# Patient Record
Sex: Female | Born: 1985 | Race: Black or African American | Hispanic: No | Marital: Single | Smoking: Never smoker
Health system: Southern US, Community
[De-identification: ages and names within clinical notes are randomized; demographics above are authoritative.]

## PROBLEM LIST (undated history)

## (undated) ENCOUNTER — Inpatient Hospital Stay: Admission: EM | Payer: Self-pay | Source: Home / Self Care

## (undated) DIAGNOSIS — Z8742 Personal history of other diseases of the female genital tract: Secondary | ICD-10-CM

## (undated) DIAGNOSIS — Z800 Family history of malignant neoplasm of digestive organs: Secondary | ICD-10-CM

## (undated) DIAGNOSIS — Z803 Family history of malignant neoplasm of breast: Secondary | ICD-10-CM

## (undated) DIAGNOSIS — G43909 Migraine, unspecified, not intractable, without status migrainosus: Secondary | ICD-10-CM

## (undated) DIAGNOSIS — Z8042 Family history of malignant neoplasm of prostate: Secondary | ICD-10-CM

## (undated) DIAGNOSIS — Z801 Family history of malignant neoplasm of trachea, bronchus and lung: Secondary | ICD-10-CM

## (undated) HISTORY — DX: Family history of malignant neoplasm of breast: Z80.3

## (undated) HISTORY — DX: Family history of malignant neoplasm of trachea, bronchus and lung: Z80.1

## (undated) HISTORY — DX: Migraine, unspecified, not intractable, without status migrainosus: G43.909

## (undated) HISTORY — PX: TUBAL LIGATION: SHX77

## (undated) HISTORY — DX: Family history of malignant neoplasm of prostate: Z80.42

## (undated) HISTORY — DX: Family history of malignant neoplasm of digestive organs: Z80.0

## (undated) HISTORY — PX: APPENDECTOMY: SHX54

---
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
## 2004-05-27 ENCOUNTER — Other Ambulatory Visit: Admission: RE | Admit: 2004-05-27 | Discharge: 2004-05-27 | Payer: Self-pay | Admitting: Gynecology

## 2004-08-11 ENCOUNTER — Inpatient Hospital Stay (HOSPITAL_COMMUNITY): Admission: AD | Admit: 2004-08-11 | Discharge: 2004-08-11 | Payer: Self-pay | Admitting: Gynecology

## 2004-09-10 ENCOUNTER — Inpatient Hospital Stay (HOSPITAL_COMMUNITY): Admission: AD | Admit: 2004-09-10 | Discharge: 2004-09-10 | Payer: Self-pay | Admitting: Gynecology

## 2005-01-17 ENCOUNTER — Inpatient Hospital Stay (HOSPITAL_COMMUNITY): Admission: AD | Admit: 2005-01-17 | Discharge: 2005-01-26 | Payer: Self-pay

## 2005-01-17 ENCOUNTER — Ambulatory Visit: Payer: Self-pay | Admitting: Internal Medicine

## 2005-01-19 ENCOUNTER — Encounter (INDEPENDENT_AMBULATORY_CARE_PROVIDER_SITE_OTHER): Payer: Self-pay | Admitting: Specialist

## 2005-03-02 ENCOUNTER — Other Ambulatory Visit: Admission: RE | Admit: 2005-03-02 | Discharge: 2005-03-02 | Payer: Self-pay | Admitting: Gynecology

## 2006-09-16 ENCOUNTER — Emergency Department (HOSPITAL_COMMUNITY): Admission: EM | Admit: 2006-09-16 | Discharge: 2006-09-17 | Payer: Self-pay | Admitting: Emergency Medicine

## 2007-01-01 ENCOUNTER — Emergency Department (HOSPITAL_COMMUNITY): Admission: EM | Admit: 2007-01-01 | Discharge: 2007-01-02 | Payer: Self-pay | Admitting: Emergency Medicine

## 2007-05-29 ENCOUNTER — Ambulatory Visit: Payer: Self-pay | Admitting: Family Medicine

## 2007-05-29 LAB — CONVERTED CEMR LAB
ALT: 8 units/L (ref 0–35)
AST: 12 units/L (ref 0–37)
Albumin: 4.4 g/dL (ref 3.5–5.2)
Alkaline Phosphatase: 58 units/L (ref 39–117)
BUN: 16 mg/dL (ref 6–23)
Basophils Absolute: 0 10*3/uL (ref 0.0–0.1)
Basophils Relative: 1 % (ref 0–1)
CO2: 25 meq/L (ref 19–32)
Calcium: 9.1 mg/dL (ref 8.4–10.5)
Chloride: 105 meq/L (ref 96–112)
Creatinine, Ser: 0.62 mg/dL (ref 0.40–1.20)
Eosinophils Absolute: 0.1 10*3/uL (ref 0.0–0.7)
Eosinophils Relative: 2 % (ref 0–5)
Glucose, Bld: 87 mg/dL (ref 70–99)
HCT: 37.6 % (ref 36.0–46.0)
Hemoglobin: 11.7 g/dL — ABNORMAL LOW (ref 12.0–15.0)
Lymphocytes Relative: 47 % — ABNORMAL HIGH (ref 12–46)
Lymphs Abs: 2 10*3/uL (ref 0.7–4.0)
MCHC: 31.1 g/dL (ref 30.0–36.0)
MCV: 84.1 fL (ref 78.0–100.0)
Monocytes Absolute: 0.3 10*3/uL (ref 0.1–1.0)
Monocytes Relative: 6 % (ref 3–12)
Neutro Abs: 1.9 10*3/uL (ref 1.7–7.7)
Neutrophils Relative %: 45 % (ref 43–77)
Platelets: 257 10*3/uL (ref 150–400)
Potassium: 4 meq/L (ref 3.5–5.3)
RBC: 4.47 M/uL (ref 3.87–5.11)
RDW: 14.9 % (ref 11.5–15.5)
Sodium: 141 meq/L (ref 135–145)
TSH: 1.449 microintl units/mL (ref 0.350–5.50)
Total Bilirubin: 0.4 mg/dL (ref 0.3–1.2)
Total Protein: 7.5 g/dL (ref 6.0–8.3)
WBC: 4.2 10*3/uL (ref 4.0–10.5)

## 2007-07-27 ENCOUNTER — Ambulatory Visit: Payer: Self-pay | Admitting: Family Medicine

## 2007-07-27 ENCOUNTER — Ambulatory Visit: Payer: Self-pay | Admitting: *Deleted

## 2007-07-27 ENCOUNTER — Encounter (INDEPENDENT_AMBULATORY_CARE_PROVIDER_SITE_OTHER): Payer: Self-pay | Admitting: Family Medicine

## 2007-07-27 LAB — CONVERTED CEMR LAB
Chlamydia, DNA Probe: POSITIVE — AB
GC Probe Amp, Genital: POSITIVE — AB

## 2007-10-04 ENCOUNTER — Ambulatory Visit: Payer: Self-pay | Admitting: Internal Medicine

## 2007-11-01 ENCOUNTER — Ambulatory Visit: Payer: Self-pay | Admitting: Internal Medicine

## 2007-11-01 ENCOUNTER — Encounter (INDEPENDENT_AMBULATORY_CARE_PROVIDER_SITE_OTHER): Payer: Self-pay | Admitting: Family Medicine

## 2007-11-01 LAB — CONVERTED CEMR LAB
Chlamydia, DNA Probe: NEGATIVE
GC Probe Amp, Genital: NEGATIVE

## 2007-12-06 ENCOUNTER — Ambulatory Visit: Payer: Self-pay | Admitting: Family Medicine

## 2008-03-06 ENCOUNTER — Ambulatory Visit: Payer: Self-pay | Admitting: Family Medicine

## 2008-03-12 ENCOUNTER — Ambulatory Visit (HOSPITAL_COMMUNITY): Admission: RE | Admit: 2008-03-12 | Discharge: 2008-03-12 | Payer: Self-pay | Admitting: Family Medicine

## 2008-03-27 ENCOUNTER — Ambulatory Visit: Payer: Self-pay | Admitting: Family Medicine

## 2008-04-10 ENCOUNTER — Ambulatory Visit: Payer: Self-pay | Admitting: Family Medicine

## 2008-09-13 ENCOUNTER — Emergency Department (HOSPITAL_COMMUNITY): Admission: EM | Admit: 2008-09-13 | Discharge: 2008-09-13 | Payer: Self-pay | Admitting: Emergency Medicine

## 2008-09-14 ENCOUNTER — Emergency Department (HOSPITAL_COMMUNITY): Admission: EM | Admit: 2008-09-14 | Discharge: 2008-09-14 | Payer: Self-pay | Admitting: Emergency Medicine

## 2008-09-23 ENCOUNTER — Inpatient Hospital Stay (HOSPITAL_COMMUNITY): Admission: AD | Admit: 2008-09-23 | Discharge: 2008-09-23 | Payer: Self-pay | Admitting: Obstetrics & Gynecology

## 2008-10-12 ENCOUNTER — Inpatient Hospital Stay (HOSPITAL_COMMUNITY): Admission: AD | Admit: 2008-10-12 | Discharge: 2008-10-12 | Payer: Self-pay | Admitting: Obstetrics and Gynecology

## 2008-11-13 ENCOUNTER — Encounter: Admission: RE | Admit: 2008-11-13 | Discharge: 2008-11-13 | Payer: Self-pay | Admitting: Obstetrics and Gynecology

## 2008-11-29 ENCOUNTER — Inpatient Hospital Stay (HOSPITAL_COMMUNITY): Admission: AD | Admit: 2008-11-29 | Discharge: 2008-11-29 | Payer: Self-pay | Admitting: Obstetrics and Gynecology

## 2009-02-23 ENCOUNTER — Inpatient Hospital Stay (HOSPITAL_COMMUNITY): Admission: AD | Admit: 2009-02-23 | Discharge: 2009-02-23 | Payer: Self-pay | Admitting: Obstetrics and Gynecology

## 2009-02-25 ENCOUNTER — Inpatient Hospital Stay (HOSPITAL_COMMUNITY): Admission: AD | Admit: 2009-02-25 | Discharge: 2009-02-25 | Payer: Self-pay | Admitting: Obstetrics and Gynecology

## 2009-02-27 ENCOUNTER — Inpatient Hospital Stay (HOSPITAL_COMMUNITY): Admission: AD | Admit: 2009-02-27 | Discharge: 2009-02-27 | Payer: Self-pay | Admitting: Obstetrics and Gynecology

## 2009-02-28 ENCOUNTER — Ambulatory Visit (HOSPITAL_COMMUNITY): Admission: AD | Admit: 2009-02-28 | Discharge: 2009-02-28 | Payer: Self-pay | Admitting: Obstetrics and Gynecology

## 2009-03-14 ENCOUNTER — Inpatient Hospital Stay (HOSPITAL_COMMUNITY): Admission: AD | Admit: 2009-03-14 | Discharge: 2009-03-15 | Payer: Self-pay | Admitting: Obstetrics and Gynecology

## 2009-03-25 ENCOUNTER — Inpatient Hospital Stay (HOSPITAL_COMMUNITY): Admission: AD | Admit: 2009-03-25 | Discharge: 2009-03-25 | Payer: Self-pay | Admitting: Obstetrics and Gynecology

## 2009-03-31 ENCOUNTER — Observation Stay (HOSPITAL_COMMUNITY): Admission: AD | Admit: 2009-03-31 | Discharge: 2009-04-01 | Payer: Self-pay | Admitting: Obstetrics and Gynecology

## 2009-04-05 ENCOUNTER — Inpatient Hospital Stay (HOSPITAL_COMMUNITY): Admission: AD | Admit: 2009-04-05 | Discharge: 2009-04-06 | Payer: Self-pay | Admitting: Obstetrics and Gynecology

## 2009-04-14 ENCOUNTER — Encounter (INDEPENDENT_AMBULATORY_CARE_PROVIDER_SITE_OTHER): Payer: Self-pay | Admitting: Obstetrics and Gynecology

## 2009-04-14 ENCOUNTER — Inpatient Hospital Stay (HOSPITAL_COMMUNITY): Admission: AD | Admit: 2009-04-14 | Discharge: 2009-04-17 | Payer: Self-pay | Admitting: Obstetrics and Gynecology

## 2010-06-15 ENCOUNTER — Emergency Department (HOSPITAL_COMMUNITY)
Admission: EM | Admit: 2010-06-15 | Discharge: 2010-06-15 | Payer: Self-pay | Source: Home / Self Care | Admitting: Family Medicine

## 2010-06-16 LAB — POCT URINALYSIS DIPSTICK
Bilirubin Urine: NEGATIVE
Hgb urine dipstick: NEGATIVE
Ketones, ur: NEGATIVE mg/dL
Nitrite: NEGATIVE
Protein, ur: NEGATIVE mg/dL
Specific Gravity, Urine: 1.02 (ref 1.005–1.030)
Urine Glucose, Fasting: NEGATIVE mg/dL
Urobilinogen, UA: 0.2 mg/dL (ref 0.0–1.0)
pH: 7 (ref 5.0–8.0)

## 2010-06-16 LAB — POCT PREGNANCY, URINE: Preg Test, Ur: NEGATIVE

## 2010-06-20 ENCOUNTER — Encounter: Payer: Self-pay | Admitting: Family Medicine

## 2010-06-30 IMAGING — US US OB COMP LESS 14 WK
1 series · 14 of 25 positions shown · non-contrast
Comparison: None

CLINICAL DATA: *Abdominal pain; ;

OBSTETRIC <14 WK US AND TRANSVAGINAL OB US
TECHNIQUE: Both transabdominal and transvaginal ultrasound
examinations were performed for complete evaluation of the
gestation as well as the maternal uterus, adnexal regions, and
pelvic cul-de-sac.

[Series 1: us ob comp less 14 wks · 14 of 25 slices shown]
[im 1/25]
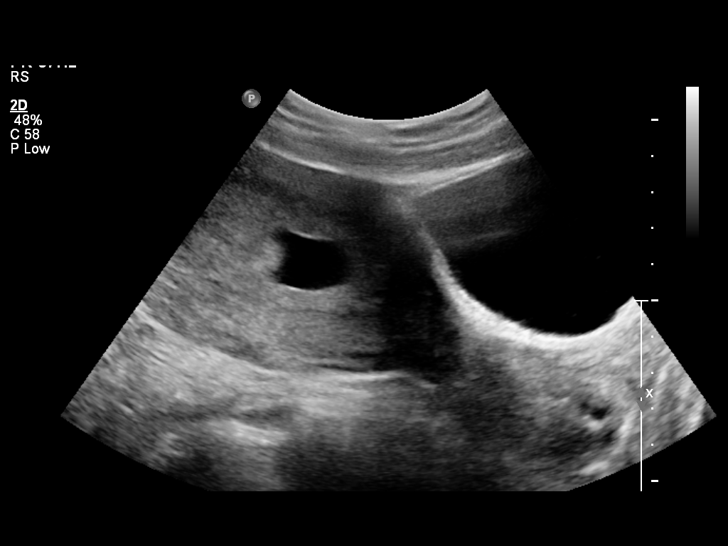
[im 3/25]
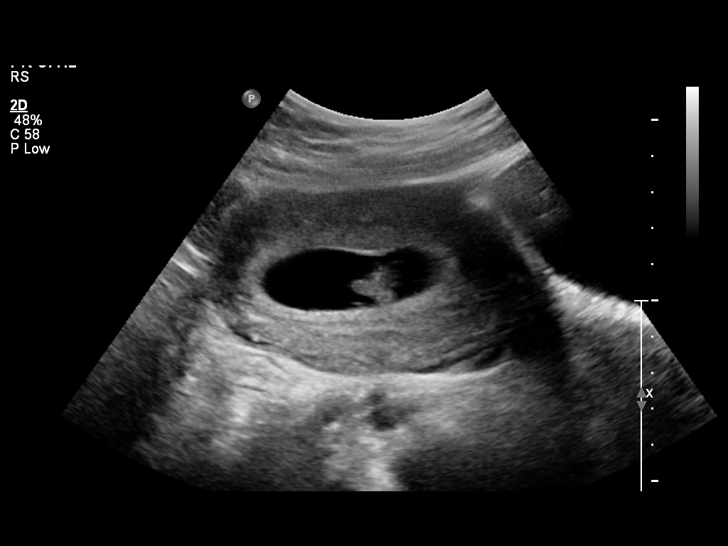
[im 5/25]
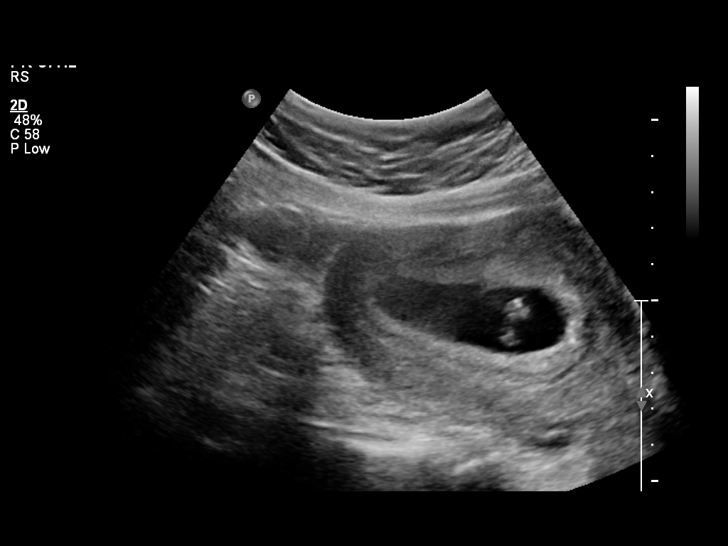
[im 7/25]
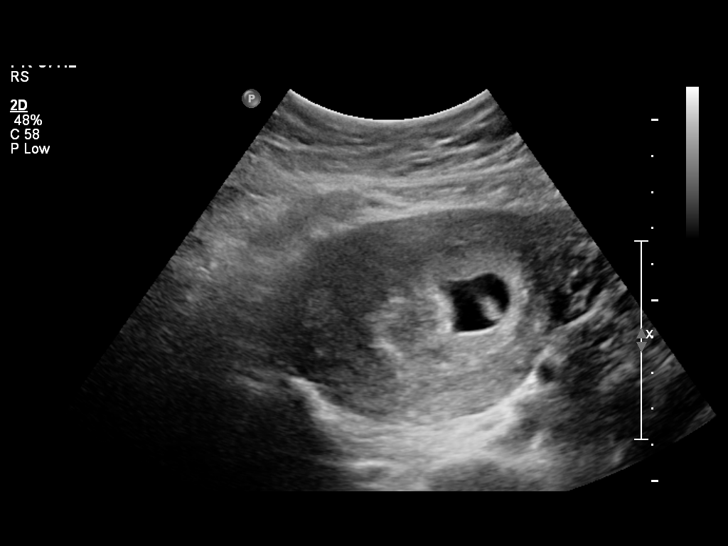
[im 9/25]
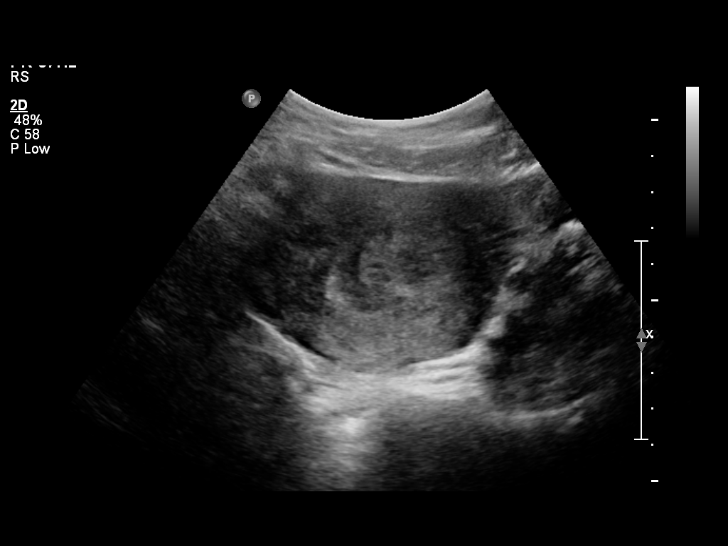
[im 10/25]
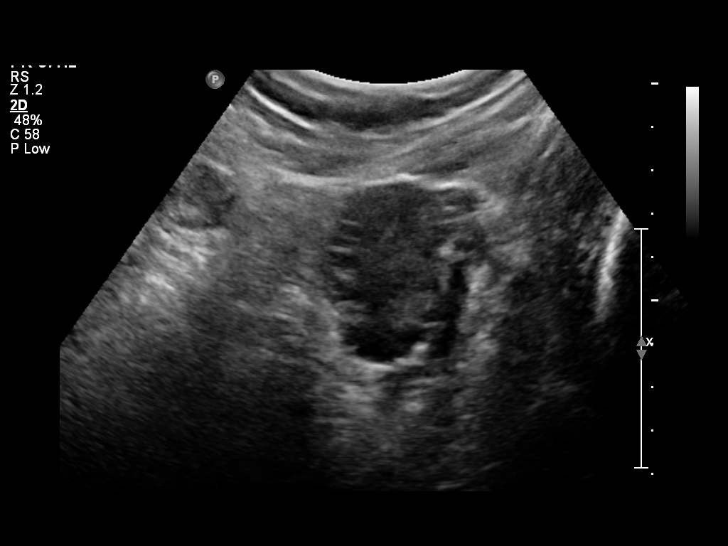
[im 12/25]
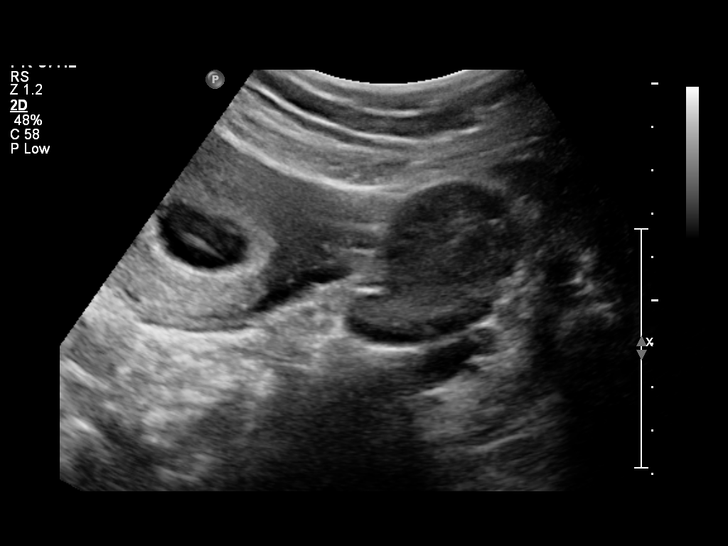
[im 14/25]
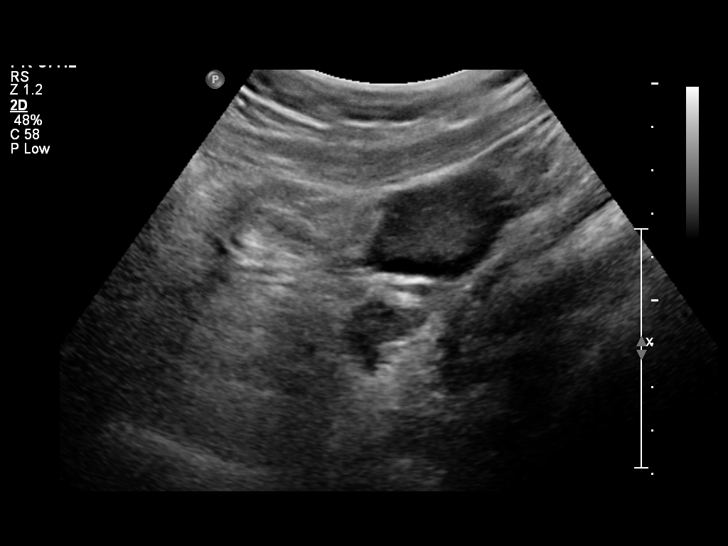
[im 16/25]
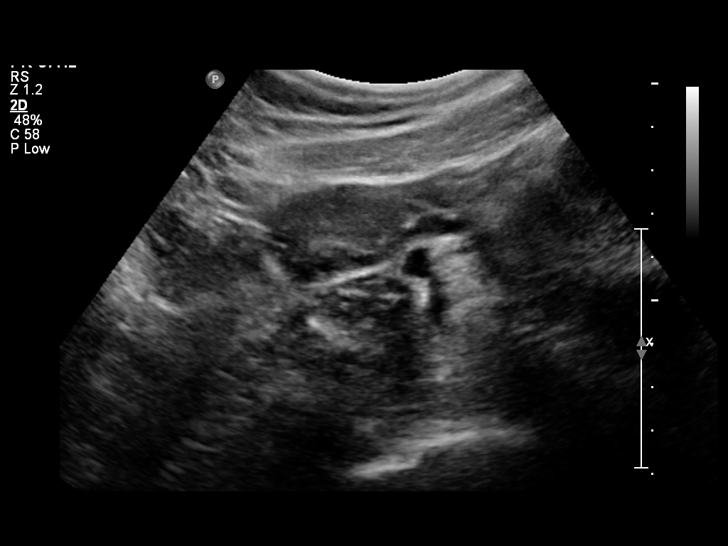
[im 17/25]
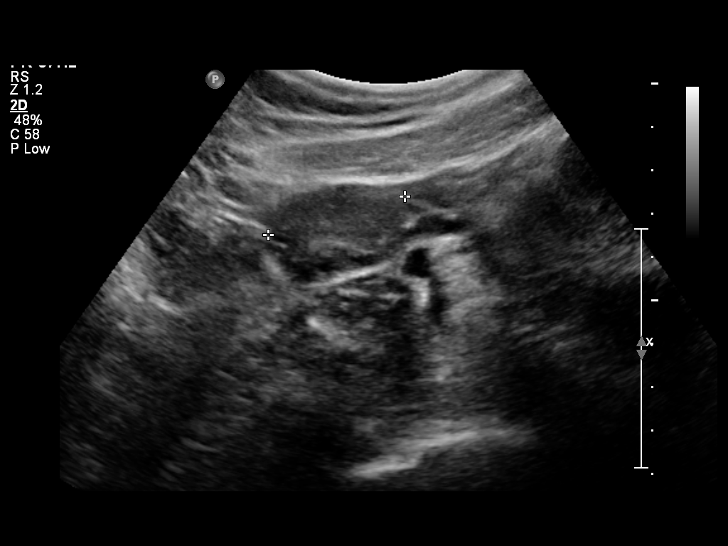
[im 19/25]
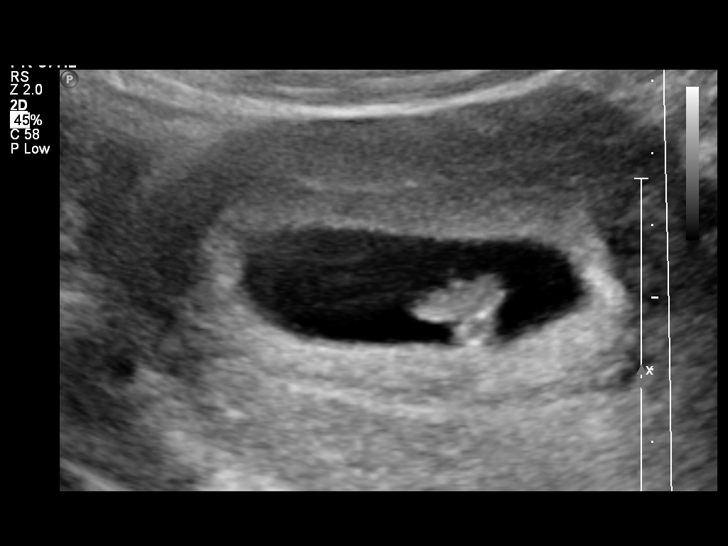
[im 21/25]
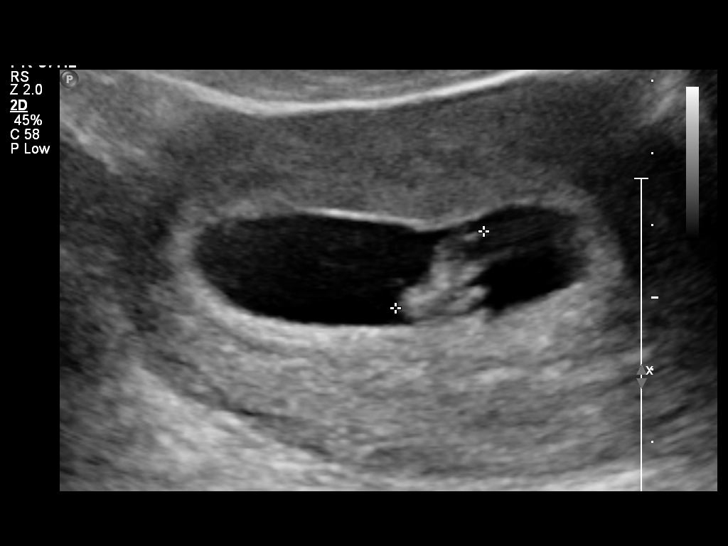
[im 23/25]
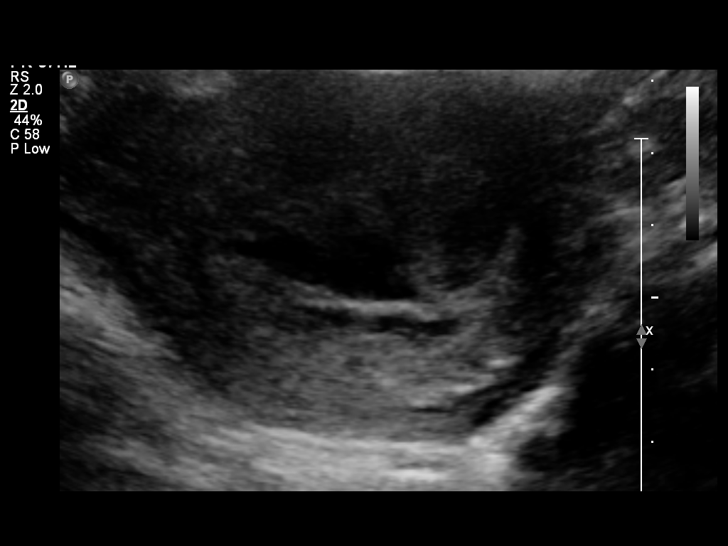
[im 25/25]
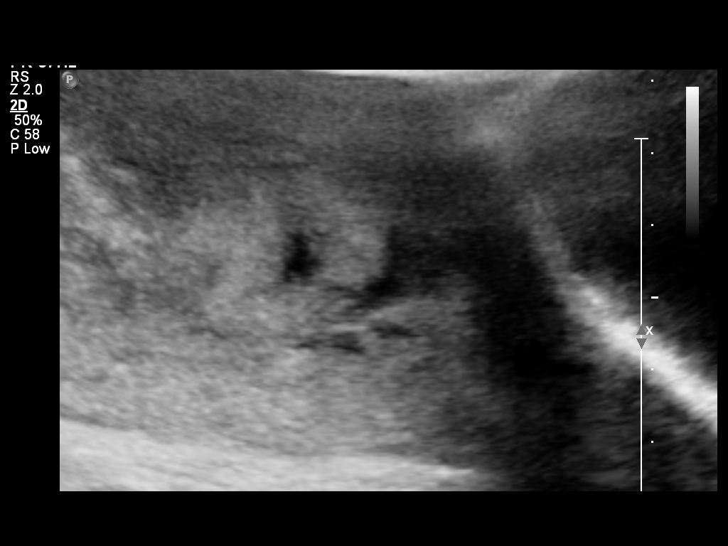

[14 of 25 positions shown; findings below may reference images not displayed]

FINDINGS: There is a single intrauterine pregnancy.  Crown-rump
length is 1.57 cm for an estimated gestational age of 8 weeks 0
days.  Fetal heart rate 156 beats per minute.  Small subchorionic
hemorrhage noted.

Ovaries are symmetric in size and echotexture.  No free fluid.
IMPRESSION: 8-week-6-day intrauterine pregnancy.  Small subchorionic
hemorrhage.

## 2010-09-01 LAB — CBC
HCT: 28.2 % — ABNORMAL LOW (ref 36.0–46.0)
HCT: 33.1 % — ABNORMAL LOW (ref 36.0–46.0)
Hemoglobin: 10.9 g/dL — ABNORMAL LOW (ref 12.0–15.0)
Hemoglobin: 6.6 g/dL — CL (ref 12.0–15.0)
Hemoglobin: 9.2 g/dL — ABNORMAL LOW (ref 12.0–15.0)
MCHC: 32.6 g/dL (ref 30.0–36.0)
MCHC: 32.8 g/dL (ref 30.0–36.0)
MCHC: 32.8 g/dL (ref 30.0–36.0)
MCV: 85.1 fL (ref 78.0–100.0)
MCV: 85.8 fL (ref 78.0–100.0)
Platelets: 202 10*3/uL (ref 150–400)
Platelets: 208 10*3/uL (ref 150–400)
RBC: 2.34 MIL/uL — ABNORMAL LOW (ref 3.87–5.11)
RBC: 3.28 MIL/uL — ABNORMAL LOW (ref 3.87–5.11)
RBC: 3.89 MIL/uL (ref 3.87–5.11)
RDW: 14.8 % (ref 11.5–15.5)
RDW: 14.8 % (ref 11.5–15.5)
WBC: 12.6 10*3/uL — ABNORMAL HIGH (ref 4.0–10.5)
WBC: 7.2 10*3/uL (ref 4.0–10.5)
WBC: 9.6 10*3/uL (ref 4.0–10.5)

## 2010-09-01 LAB — TYPE AND SCREEN
ABO/RH(D): B POS
Antibody Screen: NEGATIVE

## 2010-09-01 LAB — HEMOGLOBIN AND HEMATOCRIT, BLOOD: Hemoglobin: 8.5 g/dL — ABNORMAL LOW (ref 12.0–15.0)

## 2010-09-01 LAB — GLUCOSE, CAPILLARY
Glucose-Capillary: 126 mg/dL — ABNORMAL HIGH (ref 70–99)
Glucose-Capillary: 103 mg/dL — ABNORMAL HIGH (ref 70–99)
Glucose-Capillary: 106 mg/dL — ABNORMAL HIGH (ref 70–99)

## 2010-09-01 LAB — RPR: RPR Ser Ql: NONREACTIVE

## 2010-09-02 LAB — STREP B DNA PROBE

## 2010-09-02 LAB — CBC
HCT: 30.6 % — ABNORMAL LOW (ref 36.0–46.0)
Platelets: 185 10*3/uL (ref 150–400)
RBC: 3.59 MIL/uL — ABNORMAL LOW (ref 3.87–5.11)
WBC: 5.9 10*3/uL (ref 4.0–10.5)

## 2010-09-02 LAB — DIFFERENTIAL
Eosinophils Relative: 3 % (ref 0–5)
Lymphocytes Relative: 20 % (ref 12–46)
Lymphs Abs: 1.2 10*3/uL (ref 0.7–4.0)
Monocytes Relative: 9 % (ref 3–12)
Neutrophils Relative %: 67 % (ref 43–77)

## 2010-09-02 LAB — URINALYSIS, ROUTINE W REFLEX MICROSCOPIC
Glucose, UA: NEGATIVE mg/dL
Glucose, UA: NEGATIVE mg/dL
Hgb urine dipstick: NEGATIVE
Hgb urine dipstick: NEGATIVE
Nitrite: NEGATIVE
Protein, ur: NEGATIVE mg/dL
Specific Gravity, Urine: 1.025 (ref 1.005–1.030)
Specific Gravity, Urine: 1.025 (ref 1.005–1.030)
pH: 6 (ref 5.0–8.0)
pH: 6 (ref 5.0–8.0)
pH: 7 (ref 5.0–8.0)

## 2010-09-02 LAB — WET PREP, GENITAL
Clue Cells Wet Prep HPF POC: NONE SEEN
Trich, Wet Prep: NONE SEEN
Yeast Wet Prep HPF POC: NONE SEEN

## 2010-09-02 LAB — FETAL FIBRONECTIN
Fetal Fibronectin: NEGATIVE
Fetal Fibronectin: NEGATIVE

## 2010-09-02 LAB — GC/CHLAMYDIA PROBE AMP, GENITAL
Chlamydia, DNA Probe: NEGATIVE
GC Probe Amp, Genital: NEGATIVE

## 2010-09-03 LAB — URINALYSIS, ROUTINE W REFLEX MICROSCOPIC
Glucose, UA: NEGATIVE mg/dL
Ketones, ur: 40 mg/dL — AB
Nitrite: NEGATIVE
Protein, ur: NEGATIVE mg/dL
Protein, ur: NEGATIVE mg/dL
Urobilinogen, UA: 0.2 mg/dL (ref 0.0–1.0)

## 2010-09-03 LAB — STREP B DNA PROBE: Strep Group B Ag: NEGATIVE

## 2010-09-03 LAB — URINALYSIS, DIPSTICK ONLY
Glucose, UA: NEGATIVE mg/dL
Ketones, ur: NEGATIVE mg/dL
Leukocytes, UA: NEGATIVE
Protein, ur: NEGATIVE mg/dL

## 2010-09-03 LAB — WET PREP, GENITAL: Clue Cells Wet Prep HPF POC: NONE SEEN

## 2010-09-03 LAB — GLUCOSE, CAPILLARY: Glucose-Capillary: 88 mg/dL (ref 70–99)

## 2010-09-03 LAB — GC/CHLAMYDIA PROBE AMP, GENITAL: GC Probe Amp, Genital: NEGATIVE

## 2010-09-03 LAB — FETAL FIBRONECTIN: Fetal Fibronectin: NEGATIVE

## 2010-09-05 LAB — URINALYSIS, ROUTINE W REFLEX MICROSCOPIC
Hgb urine dipstick: NEGATIVE
Ketones, ur: NEGATIVE mg/dL
Protein, ur: NEGATIVE mg/dL
Urobilinogen, UA: 0.2 mg/dL (ref 0.0–1.0)

## 2010-09-05 LAB — CBC
Hemoglobin: 11.3 g/dL — ABNORMAL LOW (ref 12.0–15.0)
RBC: 3.89 MIL/uL (ref 3.87–5.11)
WBC: 6 10*3/uL (ref 4.0–10.5)

## 2010-09-05 LAB — COMPREHENSIVE METABOLIC PANEL
ALT: 16 U/L (ref 0–35)
AST: 18 U/L (ref 0–37)
Alkaline Phosphatase: 45 U/L (ref 39–117)
CO2: 23 mEq/L (ref 19–32)
GFR calc non Af Amer: >60 mL/min (ref >60)
Glucose, Bld: 90 mg/dL (ref 70–99)
Potassium: 3.8 mEq/L (ref 3.5–5.1)
Sodium: 135 mEq/L (ref 135–145)
Total Protein: 6.8 g/dL (ref 6.0–8.3)

## 2010-09-05 LAB — DIFFERENTIAL
Basophils Relative: 0 % (ref 0–1)
Eosinophils Absolute: 0.1 10*3/uL (ref 0.0–0.7)
Eosinophils Relative: 1 % (ref 0–5)
Monocytes Relative: 6 % (ref 3–12)
Neutrophils Relative %: 67 % (ref 43–77)

## 2010-09-05 LAB — WET PREP, GENITAL: Clue Cells Wet Prep HPF POC: NONE SEEN

## 2010-09-05 LAB — GC/CHLAMYDIA PROBE AMP, GENITAL: GC Probe Amp, Genital: NEGATIVE

## 2010-09-07 LAB — URINALYSIS, ROUTINE W REFLEX MICROSCOPIC
Leukocytes, UA: NEGATIVE
Nitrite: NEGATIVE
Protein, ur: NEGATIVE mg/dL
Specific Gravity, Urine: 1.02 (ref 1.005–1.030)
Urobilinogen, UA: 0.2 mg/dL (ref 0.0–1.0)

## 2010-09-07 LAB — CBC
HCT: 31.7 % — ABNORMAL LOW (ref 36.0–46.0)
MCHC: 34.4 g/dL (ref 30.0–36.0)
MCV: 84.6 fL (ref 78.0–100.0)
Platelets: 240 10*3/uL (ref 150–400)
RDW: 14.5 % (ref 11.5–15.5)

## 2010-09-07 LAB — URINE MICROSCOPIC-ADD ON

## 2010-09-07 LAB — HCG, QUANTITATIVE, PREGNANCY: hCG, Beta Chain, Quant, S: 153408 m[IU]/mL — ABNORMAL HIGH (ref <5)

## 2010-09-08 LAB — WET PREP, GENITAL

## 2010-09-08 LAB — CBC
HCT: 31.7 % — ABNORMAL LOW (ref 36.0–46.0)
Hemoglobin: 10.8 g/dL — ABNORMAL LOW (ref 12.0–15.0)
MCHC: 33.1 g/dL (ref 30.0–36.0)
MCV: 84.4 fL (ref 78.0–100.0)
Platelets: 239 10*3/uL (ref 150–400)
RBC: 4.26 MIL/uL (ref 3.87–5.11)
WBC: 7.7 10*3/uL (ref 4.0–10.5)
WBC: 7 10*3/uL (ref 4.0–10.5)

## 2010-09-08 LAB — URINALYSIS, ROUTINE W REFLEX MICROSCOPIC
Bilirubin Urine: NEGATIVE
Glucose, UA: NEGATIVE mg/dL
Ketones, ur: NEGATIVE mg/dL
Ketones, ur: 15 mg/dL — AB
Leukocytes, UA: NEGATIVE
Nitrite: NEGATIVE
Nitrite: POSITIVE — AB
Protein, ur: NEGATIVE mg/dL
Specific Gravity, Urine: 1.01 (ref 1.005–1.030)
Urobilinogen, UA: 0.2 mg/dL (ref 0.0–1.0)
pH: 6.5 (ref 5.0–8.0)

## 2010-09-08 LAB — COMPREHENSIVE METABOLIC PANEL
ALT: 17 U/L (ref 0–35)
AST: 15 U/L (ref 0–37)
Alkaline Phosphatase: 51 U/L (ref 39–117)
CO2: 25 mEq/L (ref 19–32)
Calcium: 9 mg/dL (ref 8.4–10.5)
GFR calc Af Amer: >60 mL/min (ref >60)
GFR calc non Af Amer: >60 mL/min (ref >60)
Potassium: 3.5 mEq/L (ref 3.5–5.1)
Sodium: 135 mEq/L (ref 135–145)

## 2010-09-08 LAB — BASIC METABOLIC PANEL
BUN: 6 mg/dL (ref 6–23)
Chloride: 103 mEq/L (ref 96–112)
GFR calc non Af Amer: >60 mL/min (ref >60)
Glucose, Bld: 108 mg/dL — ABNORMAL HIGH (ref 70–99)
Potassium: 3.8 mEq/L (ref 3.5–5.1)
Sodium: 134 mEq/L — ABNORMAL LOW (ref 135–145)

## 2010-09-08 LAB — DIFFERENTIAL
Basophils Absolute: 0 10*3/uL (ref 0.0–0.1)
Eosinophils Absolute: 0.1 10*3/uL (ref 0.0–0.7)
Eosinophils Relative: 1 % (ref 0–5)
Lymphocytes Relative: 25 % (ref 12–46)
Lymphocytes Relative: 19 % (ref 12–46)
Lymphs Abs: 1.9 10*3/uL (ref 0.7–4.0)
Monocytes Absolute: 0.5 10*3/uL (ref 0.1–1.0)
Monocytes Absolute: 0.4 10*3/uL (ref 0.1–1.0)
Neutro Abs: 5.2 10*3/uL (ref 1.7–7.7)

## 2010-09-08 LAB — URINE MICROSCOPIC-ADD ON

## 2010-09-08 LAB — GC/CHLAMYDIA PROBE AMP, GENITAL
Chlamydia, DNA Probe: NEGATIVE
GC Probe Amp, Genital: NEGATIVE

## 2010-09-08 LAB — POCT PREGNANCY, URINE: Preg Test, Ur: POSITIVE

## 2010-09-08 LAB — STREP A DNA PROBE: Group A Strep Probe: NEGATIVE

## 2010-09-08 LAB — LIPASE, BLOOD: Lipase: 25 U/L (ref 11–59)

## 2010-10-15 NOTE — Discharge Summary (Signed)
NAMEMatilde Valdez                ACCOUNT NO.:  [id number]   MEDICAL RECORD NO.:  [id number]          PATIENT TYPE:  INP   LOCATION:  9124                          [facility]ACILITY:  [facility]   PHYSICIAN:  Juan H. Lily Peer, M.D.DATE OF BIRTH:  15-Jan-1986   DATE OF ADMISSION:  01/17/2005  DATE OF DISCHARGE:  01/26/2005                                 DISCHARGE SUMMARY   HISTORY:  The patient is a 25 year old gravida 1, now para 1 who was  initially admitted at 35-and-a-half weeks gestation with premature  contractions. The patient had been poorly compliant on her terbutaline at  home. Her fetal fibronectin had been positive. She eventually agreed to go  on Procardia which she had been placed on 10 mg q.6h. Her cervix was long  and closed and her GBS culture had been negative. She was monitored and  moved over to the floor, and then on January 19, 2005, she began contracting  further and her contractions contributed to her cervical dilatation, and she  was continued on Unasyn for a suspected UTI and also to cover for group B  strep which had been pending at time of admission. The patient continued to  progress in labor and had an arrest in the second stage of labor, and was  taken to the operating room secondary to suspected cephalopelvic  disproportion at 35 weeks and failed forceps attempt. Was taken for primary  cesarean section (lower uterine segment transverse) by Dr. Douglass Rivers.  The patient postoperatively began experiencing temperature spikes. Her blood  type was B positive, rubella immune. She initially had been placed on  cefotetan for suspected endometritis. Her Tmax was 102. The patient had been  ambulating, voiding well, tolerating a diet well. She received her rubella  vaccine on her second postoperative day. She continued to spike despite  being on cefotetan. Infectious disease was consulted and etiology for her  temperature spike was never established, it was suspected as part  of  endometritis picture, but had been recommended that she be switched over to  Cipro for broader coverage. The patient had undergone abdominopelvic CT scan  on August 28. Only small bilateral effusion and basilar atelectasis were  noted, an incidental finding of a 2.5 cm right lobe mass, suspecting more  likely hemangioma; otherwise, no other abnormalities in the abdominal CT.  The pelvic CT scan had no specific changes in the pelvis, postsurgical, but  no evidence of any pelvic thrombophlebitis. The patient been kept on Cipro  500 mg b.i.d. Her blood and urine cultures had been negative. She remained  afebrile over 48-hour period. She had been given acyclovir cream for a fever  blister and she was ready to be discharged home on her seventh postoperative  day, on August 30. Her blood count on the day of discharge demonstrated  white blood count was 12.4; hemoglobin 8.9; hematocrit 27.7; and she had a  reactive thrombocytosis with a platelet count of 660,000. The patient's  highest white blood count had been 12,000 on August 24. Her staples were  removed and her incision was Steri-Stripped.  DIAGNOSES:  1.  Thirty-five-and-a-half weeks estimated gestational age.  2.  Preterm labor.  3.  Arrested second stage of labor.  4.  Postpartum anemia.  5.  Postpartum endometritis.   PROCEDURES PERFORMED:  1.  Intravenous hydration and oral tocolysis.  2.  Fetal monitoring.  3.  Primary lower uterine segment transverse cesarean section.  4.  Intravenous antibiotics.   FINDINGS:  The patient delivered a viable female infant with Apgars of 9 and  9, with a weight of 7 pounds.   FINAL DISPOSITION AND FOLLOW-UP:  Her staples were removed. The patient  received a rubella vaccine. She was instructed to continue her prenatal  vitamins and iron. Discharge instruction was provided and she was also given  a prescription for Percocet one to two tablets p.o. q.4-6h. p.r.n. pain. The  patient will be  due to follow up in the office in 6 weeks for postpartum  visit and would recommend also at that time to discuss with her follow-up  with the gastroenterologist for the incidental finding of the liver mass  which is suspected to be a hemangioma.      Juan H. Lily Peer, M.D.  Electronically Signed     JHF/MEDQ  D:  04/06/2005  T:  04/06/2005  Job:  [id number]

## 2010-10-15 NOTE — Consult Note (Signed)
NAMEMatilde Valdez                ACCOUNT NO.:  [id number]   MEDICAL RECORD NO.:  [id number]          PATIENT TYPE:  MAT   LOCATION:  MATC                          [facility]ILITY:  [facility]   PHYSICIAN:  Juan H. Lily Peer, M.D.DATE OF BIRTH:  1985-09-25   DATE OF CONSULTATION:  08/11/2004  DATE OF DISCHARGE:                                   CONSULTATION   EMERGENCY ROOM CONSULTATION:   CHIEF COMPLAINT:  First trimester vaginal bleeding.   HISTORY:  The patient is a 25 year old gravida 1, para 0 with estimated date  of confinement February 18, 2005, currently [redacted] weeks gestation, who  presented to the emergency room this morning complaining of vaginal  bleeding.  Denies any recent intercourse.  Patient had an ultrasound at  [facility] on July 09, 2004, at [redacted] weeks gestation which  demonstrated she had a complete placenta previa.  She was sent to the  ultrasound department for immediate ultrasound scan and for viability.  A  viable intrauterine pregnancy was noted, at approximately 13 weeks and 2  days was evident.  No report of any previa at this time, normal amniotic  fluid, fetal heart rate 158 per minute, cervical length was 4.2 cm.  Her  blood work that was done in triage demonstrated her blood type being B  positive, hemoglobin and hematocrit 11.3 and 34.0, platelet count 304,000.  The patient had denied any recent trauma or intercourse and was in no acute  distress.   PHYSICAL EXAMINATION:  Temperature 98.2, pulse 70, respirations 20, blood  pressure 116/57.  HEENT:  Unremarkable.  NECK:  Supple.  Trachea midline.  No carotid bruits, no thyromegaly.  LUNGS:  Clear to auscultation without rhonchi or wheezes.  HEART:  Regular rate and rhythm, no murmurs or gallops.  BREAST EXAM:  Not done.  ABDOMEN:  Soft, nontender without rebound or guarding, positive fetal heart  tones, good bowel sounds.  PELVIC EXAM:  Bartholin urethra, Skene gland is within normal limits.  Vagina  with a large blood clot seen in the vaginal vault which was removed.  The cervix was closed, no active bleeding.  BI-MANUAL EXAMINATION:  Approximately 13-14 weeks size uterus, no rebound or  guarding.   ASSESSMENT:  Second trimester vaginal bleeding, no evidence of etiology;  although, based on her history of a previous ultrasound at [facility] on July 09, 2004, she had a complete placenta previa, it  looks like the placenta has moved from the internal cervical os and perhaps  this bleeding was a blood clot that was __________ because no other  abnormality was noted on the ultrasound.  The patient was given a note to  refrain from any strenuous activity and from work for the next 3 days and at  rest and she will be followed up in the office the following week.     JHF/MEDQ  D:  08/11/2004  T:  08/11/2004  Job:  [id number]

## 2010-10-15 NOTE — Op Note (Signed)
NAMEMatilde Valdez                ACCOUNT NO.:  [id number]   MEDICAL RECORD NO.:  [id number]          PATIENT TYPE:  INP   LOCATION:  9124                          [facility]ILITY:  [facility]   PHYSICIAN:  Ivor Costa. Farrel Gobble, M.D. DATE OF BIRTH:  01/29/1986   DATE OF PROCEDURE:  01/19/2005  DATE OF DISCHARGE:                                 OPERATIVE REPORT   PREOPERATIVE DIAGNOSES:  1.  Cephalopelvic disproportion.  2.  Thirty-five-plus weeks with labor.  3.  Contracting pelvis.  4.  Failed forceps.   POSTOPERATIVE DIAGNOSES:  1.  Cephalopelvic disproportion.  2.  Thirty-five-plus weeks with labor.  3.  Contracting pelvis.  4.  Failed forceps.   OPERATION/PROCEDURE:  Primary cesarean section, low flap, transverse.   SURGEON:  Ivor Costa. Farrel Gobble, M.D.   ANESTHESIA:  Epidural.   FLUIDS REPLACED:  2200 mL lactated Ringer's.   ESTIMATED BLOOD LOSS:  600 mL.   URINE OUTPUT:  Scant bloody.   FINDINGS:  Viable female infant in the vertex presentation, OA position,  spontaneous cry, Apgars 9 and 9, birth weight 7 pounds.  Normal uterus,  tubes and ovaries.   COMPLICATIONS:  None.   PATHOLOGY:  Placenta.   DESCRIPTION OF PROCEDURE:  The patient was taken to the operating room,  placed in the supine position with left lateral displacement, prepped and  draped in the usual sterile fashion.  After adequate anesthesia was insured,  a Pfannenstiel skin incision was made with scalpel and carried to the  underlying layer of fascia with electrocautery.  The fascia was scored in  the midline.  Incision was extended laterally with electrocautery.  The  inferior aspect of the fascial incision was grasped with the Kochers.  Underlying rectus muscles were dissected off with blunt and sharp  dissection.  In similar fashion, the superior aspect of incision was grasped  with Kochers and underlying rectus muscles were dissected off.  The rectus  muscles were separated in the midline.  The peritoneum was  entered bluntly.  The peritoneal incision was entered superiorly and inferiorly.  Good  visualization of the underlying bowel and bladder.  The orientation of the  uterus is confirmed.  The vesicouterine peritoneum was identified and  incised sharply with the Metzenbaum scissors and extended laterally.  The  bladder blade was reinserted and the lower uterine segment was incised in a  transverse fashion with the scalpel.  The incision was extended bluntly.  The infant was deep in the pelvis and was easily brought up through the  incision.  Cord was cut and clamped and the infant handed off to the waiting  pediatricians.  Cord bloods were obtained.  The uterus was massaged and  placenta was allowed to separate naturally.  The uterus was then cleared of  all clots and debris.  The uterine incision was repaired with running locked  layer of 0 chromic and a second suture was used for imbrication.  The adnexa  were inspected and noted to be unremarkable.  There was minimal bleeding of  the uterine incision repaired with 3-0 Vicryl.  The pelvis  was irrigated  with copious amounts of warm saline.  Gutters were cleared of all clots and  debris.  The peritoneum, muscle and fascia were noted to be hemostatic.  The  fascia was then closed with 0 Vicryl in a running fashion.  The subcutaneous  tissue was irrigated.  Skin was closed with staples.  The patient tolerated  the procedure well.  Sponge, lap and needle counts were correct x2.  She was  transferred to the PACU in stable condition.  She received Ancef  intraoperatively.      Ivor Costa. Farrel Gobble, M.D.  Electronically Signed     THL/MEDQ  D:  01/19/2005  T:  01/20/2005  Job:  [id number]

## 2010-10-15 NOTE — H&P (Signed)
NAMEMatilde Valdez                ACCOUNT NO.:  [id number]   MEDICAL RECORD NO.:  [id number]          PATIENT TYPE:  INP   LOCATION:  9162                          [facility]   PHYSICIAN:  Ivor Costa. Farrel Gobble, M.D. DATE OF BIRTH:  12-18-1985   DATE OF ADMISSION:  01/17/2005  DATE OF DISCHARGE:                                HISTORY & PHYSICAL   PRINCIPAL DIAGNOSIS:  Contractions.   HISTORY OF PRESENT ILLNESS:  The patient is a 25 year old G1 with an  estimated date of confinement of February 18, 2005, based on the first  trimester ultrasound, who called this morning complaining of pains all over,  shooting up her back, denied contractions.  The patient could not be more  specific on her discomfort and was asked to come to triage.  She was noted  to be contracting about every 2 minutes, and her exam was 2 cm per the  nursing staff.  Based on that, the patient had a __________ done.  She had a  CBC.  She had routine admit labs including a urinalysis.  The patient had  been seen in the office just 4 days before and was noted to have a shortened  cervix by ultrasound at 0.6, however, was closed.  At that time, she had GC,  Chlamydia, GBS, all of which were negative.  She had fetal fibronectin which  was done which was, however, positive.  The patient was not contracting at  that point.  The patient was admitted and despite being here for 6 hours and  regular contractions, the patient has not changed her cervix.  Her pregnancy  has otherwise been complicated by multiple nondescript complaints.  The  patient was also noted to have first trimester bacterial vaginosis.  She  also has a history of Chlamydia.  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  The  patient reports good fetal movement and no vaginal bleeding.  She is B  positive, antibody negative, RPR nonreactive, rubella negative.  Her  hepatitis B surface antigen nonreactive and HIV negative.  Her GBS status is  pending at the time of  this dictation, please refer to the [facility].   PHYSICAL EXAMINATION:  VITAL SIGNS:  She is afebrile.  Her vitals are  stable.  GENERAL:  She appears her stated age in no acute distress.  HEART:  Regular rate.  LUNGS:  Clear to auscultation.  ABDOMEN:  Gravid and nontender.  VAGINAL EXAM:  A loose 1, 80%.  A bag was palpated.  The fetal part,  however, was not.   NST is reactive.  Contractions are 2-2.   ASSESSMENT:  A 35-1/2 week pregnancy with contractions despite which,  however, the patient has not changed her cervix.  We had a long discussion  with the patient and her family with regard to the risks of prematurity.  At  this point, we will go ahead and try to IV hydrate her and give her some  narcotics to see if we can slow down if not eliminate these contractions, as  the patient has not changed her cervix,  risks and benefits of which were  discussed and accepted with the family.  Also of note, the patient was noted  to have marked ketones on her urine with trace  leukocyte esterase and moderate hemoglobin as well as a few bacteria.  The  patient was started on ampicillin 2 g IV followed by 1 g q.4h., and we will  watch her expectantly.  If she, however, breaks through, she will be allowed  to labor.      Ivor Costa. Farrel Gobble, M.D.  Electronically Signed     THL/MEDQ  D:  01/17/2005  T:  01/17/2005  Job:  [id number]

## 2011-01-06 IMAGING — US US OB LIMITED
1 series · 7 of 7 positions shown · non-contrast
Comparison: none

OBSTETRICAL ULTRASOUND:
 This ultrasound exam was performed in the [HOSPITAL] Ultrasound Department.  The OB US report was generated in the AS system, and faxed to the ordering physician.  This report is also available in [HOSPITAL]?s [employer] and in [REDACTED] PACS.

[Series 1: us fetal bpp w/o nonstress · non-contrast · 7 of 7 slices shown]
[im 1/7]
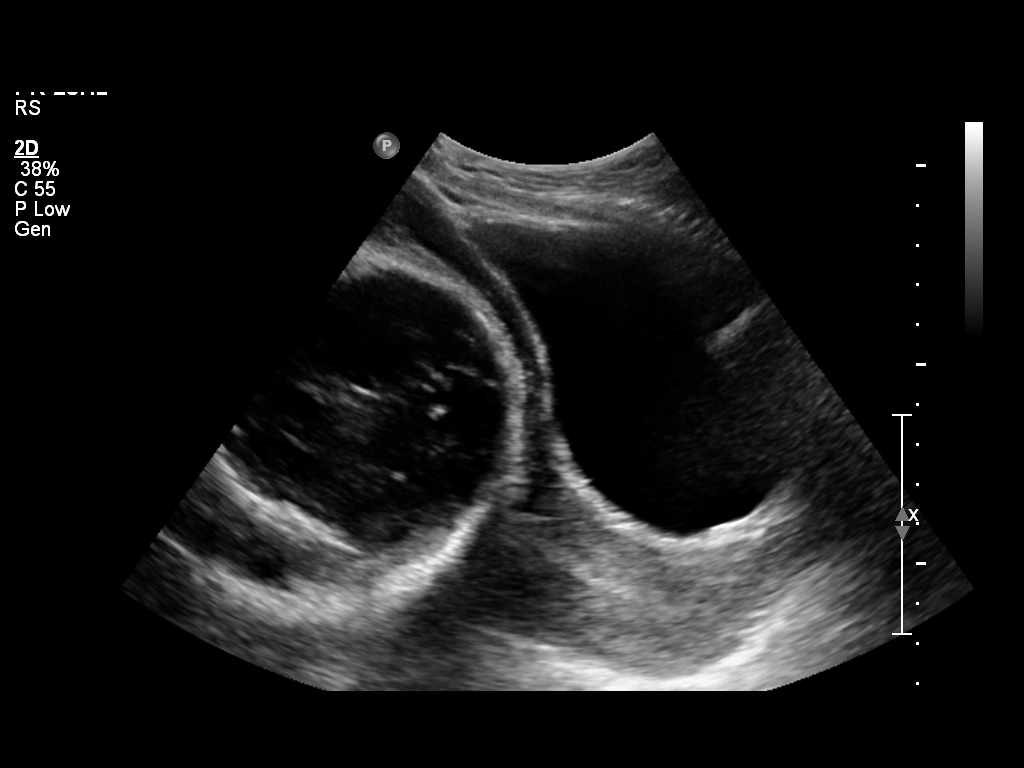
[im 2/7]
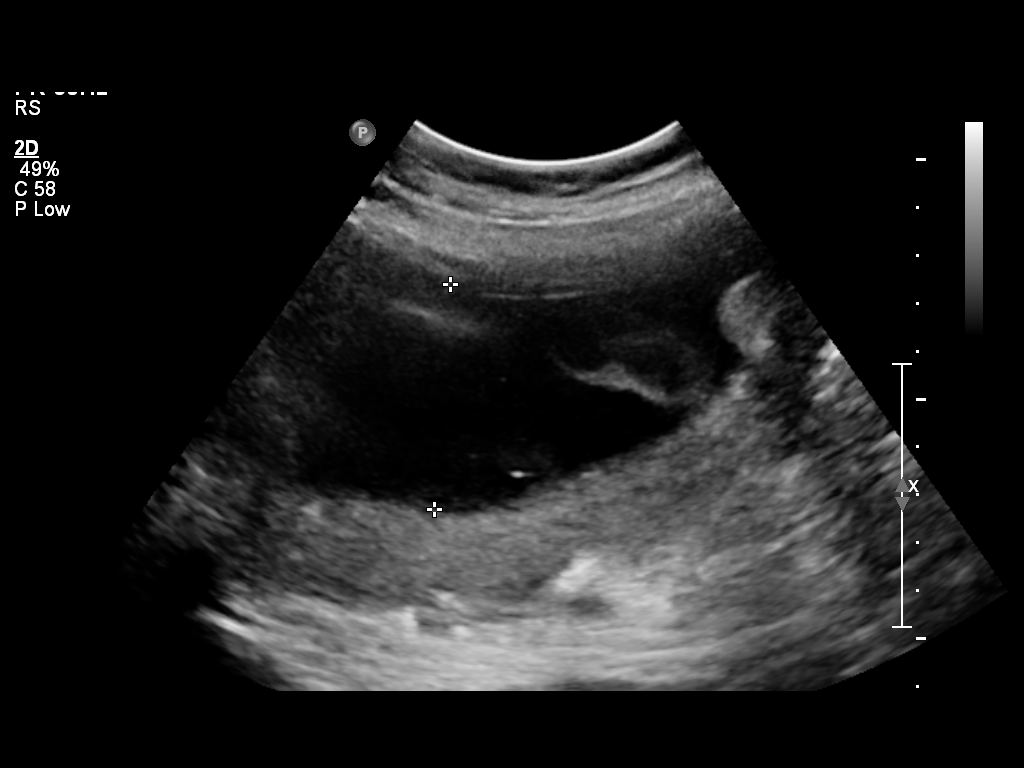
[im 3/7]
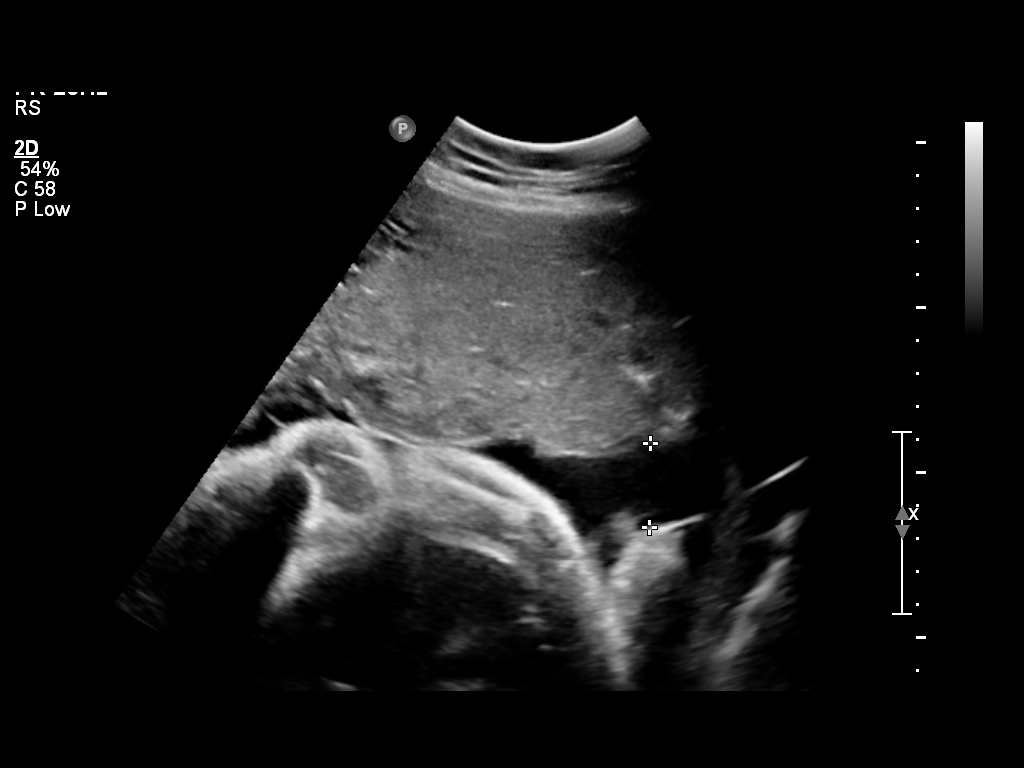
[im 4/7]
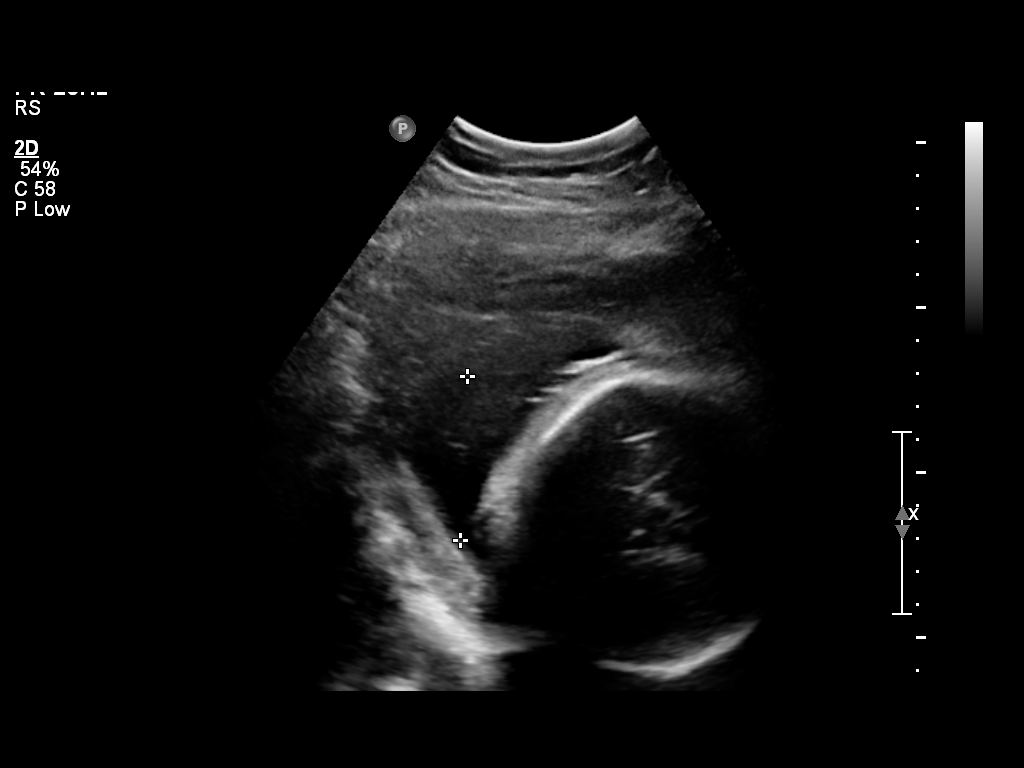
[im 5/7]
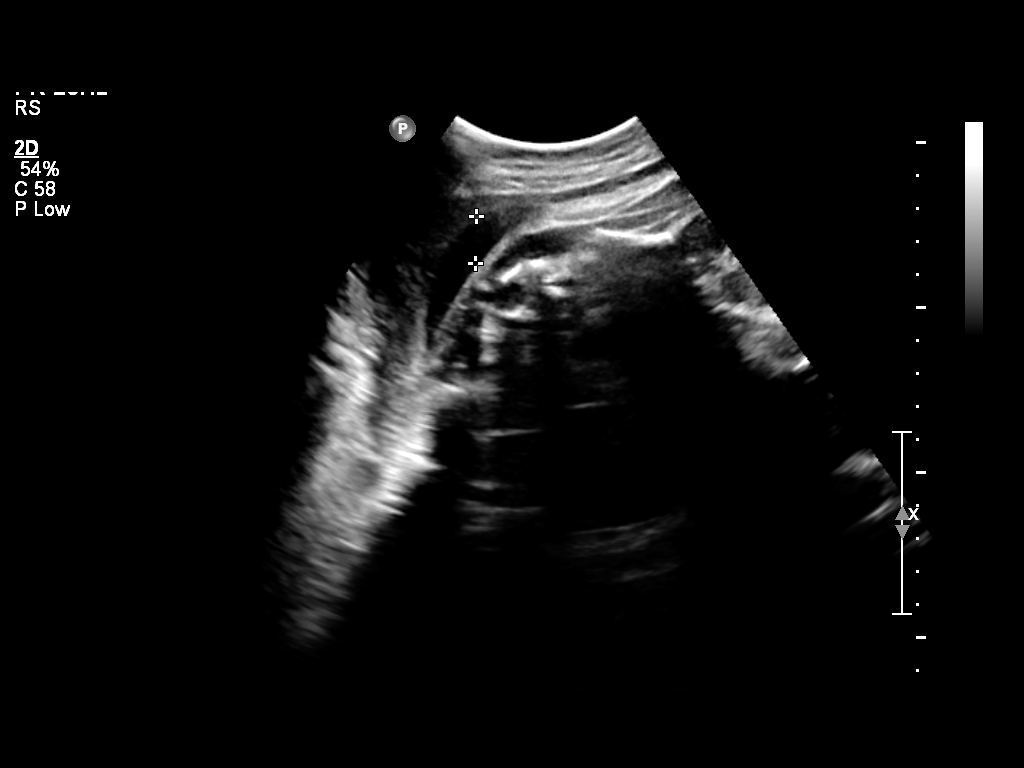
[im 6/7]
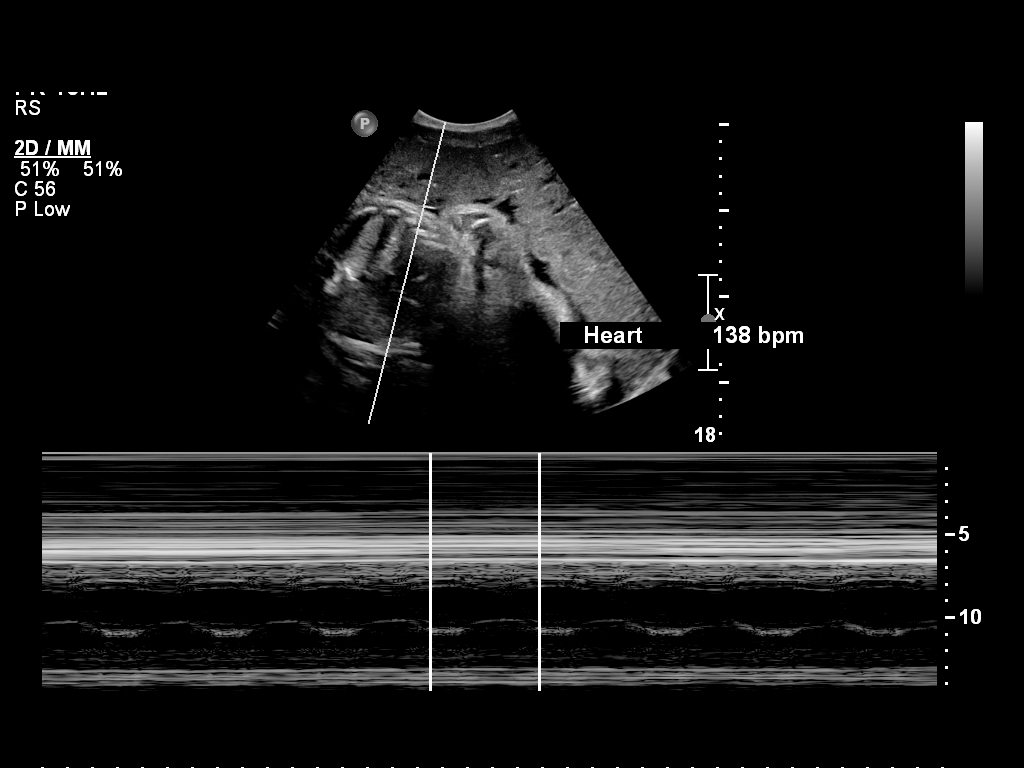
[im 7/7]
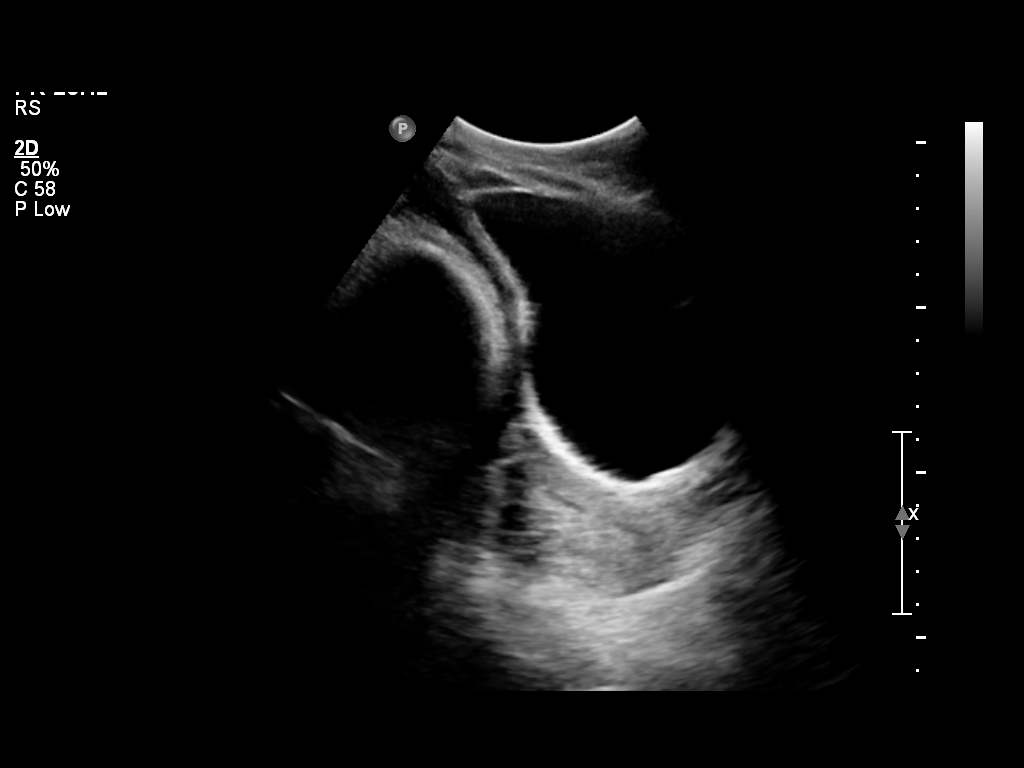

[7 of 7 positions shown; findings below may reference images not displayed]

IMPRESSION: See AS Obstetric US report.

## 2011-12-05 ENCOUNTER — Encounter (HOSPITAL_BASED_OUTPATIENT_CLINIC_OR_DEPARTMENT_OTHER): Payer: Self-pay | Admitting: *Deleted

## 2011-12-05 ENCOUNTER — Emergency Department (HOSPITAL_BASED_OUTPATIENT_CLINIC_OR_DEPARTMENT_OTHER): Payer: Worker's Compensation

## 2011-12-05 ENCOUNTER — Emergency Department (HOSPITAL_BASED_OUTPATIENT_CLINIC_OR_DEPARTMENT_OTHER)
Admission: EM | Admit: 2011-12-05 | Discharge: 2011-12-05 | Disposition: A | Discharge status: Home / Self Care | Payer: Worker's Compensation | Attending: Emergency Medicine | Admitting: Emergency Medicine

## 2011-12-05 DIAGNOSIS — X500XXA Overexertion from strenuous movement or load, initial encounter: Secondary | ICD-10-CM | POA: Insufficient documentation

## 2011-12-05 DIAGNOSIS — S93402A Sprain of unspecified ligament of left ankle, initial encounter: Secondary | ICD-10-CM

## 2011-12-05 DIAGNOSIS — S93409A Sprain of unspecified ligament of unspecified ankle, initial encounter: Secondary | ICD-10-CM | POA: Insufficient documentation

## 2011-12-05 NOTE — ED Notes (Signed)
Pt presents to ED today with injury to left ankle while running down stairs.  Pt has mild swelling noted and pain increase with palpation

## 2011-12-05 NOTE — ED Provider Notes (Signed)
History     CSN: [id number]  Arrival date & time 12-18-2011  9147   First MD Initiated Contact with Patient December 18, 2011 0251      Chief Complaint  Patient presents with  . Ankle Injury    (Consider location/radiation/quality/duration/timing/severity/associated sxs/prior treatment) HPI This is a 17 your black female who was walking down the stairs at work this morning when she inverted her left foot at the ankle. She's complaining of moderate pain over the lateral and anterior aspects of the left ankle. The pain is worse with movement, attempted ambulation or palpation. She denies other injury.  History reviewed. No pertinent past medical history.  History reviewed. No pertinent past surgical history.  History reviewed. No pertinent family history.  History  Substance Use Topics  . Smoking status: Never Smoker   . Smokeless tobacco: Not on file  . Alcohol Use: No    OB History    Grav Para Term Preterm Abortions TAB SAB Ect Mult Living                  Review of Systems  All other systems reviewed and are negative.    Allergies  Review of patient's allergies indicates no known allergies.  Home Medications  No current outpatient prescriptions on file.  BP 106/61  Pulse 64  Temp 98.4 F (36.9 C) (Oral)  Resp 20  Ht 5\' 1"  (1.549 m)  Wt 186 lb (84.369 kg)  BMI 35.14 kg/m2  SpO2 98%  LMP 11/25/2011  Physical Exam General: Well-developed, well-nourished female in no acute distress; appearance consistent with age of record HENT: normocephalic, atraumatic Eyes: Normal appearance Neck: supple Heart: regular rate and rhythm Lungs: Normal respiratory effort and excursion Abdomen: soft; nondistended Extremities: Swelling and tenderness over her left lateral malleolus; left ankle joint stable; no deformity; pulses normal; left foot distally neurovascularly intact with intact tendon function Neurologic: Awake, alert and oriented; motor function intact in all extremities  and symmetric; no facial droop Skin: Warm and dry Psychiatric: Normal mood and affect    ED Course  Procedures (including critical care time)     MDM   Nursing notes and vitals signs, including pulse oximetry, reviewed.  Summary of this visit's results, reviewed by myself:  Imaging Studies: Dg Ankle Complete Left  12/18/11  *RADIOLOGY REPORT*  Clinical Data: Injury to left ankle, with left lateral ankle pain.  LEFT ANKLE COMPLETE - 3+ VIEW  Comparison: None.  Findings: There is no evidence of fracture or dislocation.  The ankle mortise is intact; the interosseous space is within normal limits.  No talar tilt or subluxation is seen.  A likely large bone island is incidentally noted at the anterior talus.  The joint spaces are preserved.  Mild lateral soft tissue swelling is noted.  IMPRESSION: No evidence of fracture or dislocation.  Original Report Authenticated By: Tonia Ghent, M.D.            Hanley Seamen, MD 12-18-11 [phone]

## 2012-03-20 ENCOUNTER — Encounter: Payer: Self-pay | Admitting: Obstetrics and Gynecology

## 2012-03-20 ENCOUNTER — Ambulatory Visit (INDEPENDENT_AMBULATORY_CARE_PROVIDER_SITE_OTHER): Payer: Commercial Indemnity | Admitting: Obstetrics and Gynecology

## 2012-03-20 VITALS — BP 102/60 | HR 64 | Ht 60.0 in | Wt 194.0 lb

## 2012-03-20 DIAGNOSIS — R109 Unspecified abdominal pain: Secondary | ICD-10-CM

## 2012-03-20 DIAGNOSIS — Z124 Encounter for screening for malignant neoplasm of cervix: Secondary | ICD-10-CM

## 2012-03-20 DIAGNOSIS — Z803 Family history of malignant neoplasm of breast: Secondary | ICD-10-CM | POA: Insufficient documentation

## 2012-03-20 DIAGNOSIS — N915 Oligomenorrhea, unspecified: Secondary | ICD-10-CM | POA: Insufficient documentation

## 2012-03-20 DIAGNOSIS — R300 Dysuria: Secondary | ICD-10-CM

## 2012-03-20 DIAGNOSIS — N840 Polyp of corpus uteri: Secondary | ICD-10-CM | POA: Insufficient documentation

## 2012-03-20 MED ORDER — MEDROXYPROGESTERONE ACETATE 10 MG PO TABS: 10.0000 mg | ORAL_TABLET | Freq: Every day | ORAL | Status: DC

## 2012-03-20 NOTE — Patient Instructions (Signed)
Abdominal Pain  Abdominal pain can be caused by many things. Your caregiver decides the seriousness of your pain by an examination and possibly blood tests and X-rays. Many cases can be observed and treated at home. Most abdominal pain is not caused by a disease and will probably improve without treatment. However, in many cases, more time must pass before a clear cause of the pain can be found. Before that point, it may not be known if you need more testing, or if hospitalization or surgery is needed.  HOME CARE INSTRUCTIONS   · Do not take laxatives unless directed by your caregiver.  · Take pain medicine only as directed by your caregiver.  · Only take over-the-counter or prescription medicines for pain, discomfort, or fever as directed by your caregiver.  · Try a clear liquid diet (broth, tea, or water) for as long as directed by your caregiver. Slowly move to a bland diet as tolerated.  SEEK IMMEDIATE MEDICAL CARE IF:   · The pain does not go away.  · You have a fever.  · You keep throwing up (vomiting).  · The pain is felt only in portions of the abdomen. Pain in the right side could possibly be appendicitis. In an adult, pain in the left lower portion of the abdomen could be colitis or diverticulitis.  · You pass bloody or black tarry stools.  MAKE SURE YOU:   · Understand these instructions.  · Will watch your condition.  · Will get help right away if you are not doing well or get worse.  Document Released: 02/23/2005 Document Revised: 08/08/2011 Document Reviewed: 01/02/2008  Exit[employer]® Patient Information ©2013 [employer].

## 2012-03-20 NOTE — Progress Notes (Signed)
Last Pap: 10/20/08 WNL: Yes Regular Periods:no Contraception: BTL  Monthly Breast exam:yes Tetanus<81yrs:yes Nl.Bladder Function:no, overactive  Daily BMs:yes Healthy Diet:yes Calcium:no Mammogram:no Date of Mammogram: n/a Exercise:yes Have often Exercise: walking daily Seatbelt: yes Abuse at home: no Stressful work:no Sigmoid-colonoscopy: n/a Bone Density: No PCP: none Change in PMH: none  Change in [facility]: none BP 102/60  Pulse 64  Ht 5' (1.524 m)  Wt 194 lb (87.998 kg)  BMI 37.89 kg/m2  LMP 11/28/2011 Pt with complaints:yes and 1.  She only has a menses q 3 -7 months.  When she does have it she states the bleeding is heavy.  She also has a h/o wndometrial polyps.  2.  Her mother was dxd with breast cancer at age 24. 3.  She has urinary frequency and urgency.  She drinks water and juice throughout the day.  She has a cup of coffee each day.  No burning or dysuria.  4.  She has occ pain in her upper abdomen.  It is sporadic but painful.  Nothing makes it better or worse.   Physical Examination: General appearance - alert, well appearing, and in no distress Mental status - normal mood, behavior, speech, dress, motor activity, and thought processes Neck - supple, no significant adenopathy,  thyroid exam: thyroid is normal in size without nodules or tenderness Chest - clear to auscultation, no wheezes, rales or rhonchi, symmetric air entry Heart - normal rate and regular rhythm Abdomen - soft, nontender, nondistended, no masses or organomegaly Breasts - breasts appear normal, no suspicious masses, no skin or nipple changes or axillary nodes Pelvic - normal external genitalia, vulva, vagina, cervix, uterus and adnexa Rectal - rectal exam not indicated Back exam - full range of motion, no tenderness, palpable spasm or pain on motion Neurological - alert, oriented, normal speech, no focal findings or movement disorder noted Musculoskeletal - no joint tenderness, deformity or  swelling Extremities - no edema, redness or tenderness in the calves or thighs Skin - normal coloration and turgor, no rashes, no suspicious skin lesions noted Routine exam H/o endometrial polyps Urinary freqency Mother died from breast CA at age 40 Provera to induce menses Pap sent yes Mammogram due no start at age 53 Check urine culture shg for evaluation of uterus.  Pt with h/o endometrial polyps nothing is  used for contraception RT for shg/f/u Refer to GI

## 2012-03-20 NOTE — Addendum Note (Signed)
Addended by: Loralyn Freshwater on: 03/20/2012 02:44 PM   Modules accepted: Orders

## 2012-03-21 ENCOUNTER — Telehealth: Payer: Self-pay

## 2012-03-21 NOTE — Telephone Encounter (Signed)
Spoke with pt rgd referral pt has appt 03/26/12 at 3:15 with Dr.Hung pt voice understanding

## 2012-03-21 NOTE — Telephone Encounter (Signed)
Message copied by Rolla Plate on Wed Mar 21, 2012 12:03 PM ------      Message from: Jaymes Graff      Created: Tue Mar 20, 2012  2:40 PM       Please refer pt to GI she has abdominal pain

## 2012-03-22 LAB — PAP IG W/ RFLX HPV ASCU

## 2012-03-22 LAB — URINE CULTURE: Colony Count: 50000

## 2012-03-23 ENCOUNTER — Telehealth: Payer: Self-pay

## 2012-03-23 MED ORDER — PENICILLIN V POTASSIUM 500 MG PO TABS: 500.0000 mg | ORAL_TABLET | Freq: Four times a day (QID) | ORAL | Status: DC

## 2012-03-23 NOTE — Telephone Encounter (Signed)
Lm on vm tcb rgd labs 

## 2012-03-23 NOTE — Telephone Encounter (Signed)
Spoke with pt informed gbs in urine need abx rx sent to pharm pt voice understanding

## 2012-03-23 NOTE — Telephone Encounter (Signed)
Message copied by Rolla Plate on Fri Mar 23, 2012 10:09 AM ------      Message from: Jaymes Graff      Created: Fri Mar 23, 2012  1:07 AM       Pt with GBS in her urine.  Please rx PCN vk 500mg  QID for 7days.      ----- Message -----         From: [facility] Interface         Sent: 03/22/2012  11:13 AM           To: Michael Litter, MD

## 2012-04-03 ENCOUNTER — Encounter: Payer: Self-pay | Admitting: Obstetrics and Gynecology

## 2012-04-03 ENCOUNTER — Ambulatory Visit (INDEPENDENT_AMBULATORY_CARE_PROVIDER_SITE_OTHER): Payer: Commercial Indemnity

## 2012-04-03 ENCOUNTER — Ambulatory Visit (INDEPENDENT_AMBULATORY_CARE_PROVIDER_SITE_OTHER): Payer: Commercial Indemnity | Admitting: Obstetrics and Gynecology

## 2012-04-03 ENCOUNTER — Other Ambulatory Visit: Payer: Self-pay | Admitting: Obstetrics and Gynecology

## 2012-04-03 VITALS — BP 100/64 | HR 70 | Wt 194.0 lb

## 2012-04-03 DIAGNOSIS — Z139 Encounter for screening, unspecified: Secondary | ICD-10-CM

## 2012-04-03 DIAGNOSIS — Z124 Encounter for screening for malignant neoplasm of cervix: Secondary | ICD-10-CM

## 2012-04-03 DIAGNOSIS — N926 Irregular menstruation, unspecified: Secondary | ICD-10-CM

## 2012-04-03 NOTE — Progress Notes (Signed)
26 YO S/P tubal sterilzatiion with irregular bleeding and a history of endometrial polyp presents for a SHG. For the past 2 years patient's flow has been heavier with her  flow x 5 days with pad change every 2 hours with clots but no cramps.  Occasionally will skip periods and will go as many as 6 months without a period.    O: SHG/US: uterus-8.94 x 5.16 x 3.87 cm, endometrium-0.587 cm; uniform appearance of endometrial walls, no focal lesions seen.  3D images demonstrate      no focal mass   UPT-negative  A: Perceived Heavy Bleeding     Oligomenorrhea      History of Endometrial Polyp  P: Reviewed results of SHG and ways to manage (perceived) heavy menses:  Mirena,     Oral Contraceptives, endometrial ablation and hysterectomy-patient has no thoughts     on these options at the moment.      RTO-as scheduled or prn  Muhamad Serano, PA-C

## 2012-08-07 ENCOUNTER — Telehealth: Payer: Self-pay | Admitting: Obstetrics and Gynecology

## 2012-08-08 ENCOUNTER — Encounter: Payer: Self-pay | Admitting: Family Medicine

## 2012-08-08 ENCOUNTER — Ambulatory Visit: Payer: Commercial Indemnity | Admitting: Family Medicine

## 2012-08-08 VITALS — BP 114/62 | HR 68 | Wt 193.0 lb

## 2012-08-08 DIAGNOSIS — N760 Acute vaginitis: Secondary | ICD-10-CM

## 2012-08-08 DIAGNOSIS — N898 Other specified noninflammatory disorders of vagina: Secondary | ICD-10-CM

## 2012-08-08 LAB — POCT WET PREP (WET MOUNT)
Clue Cells Wet Prep Whiff POC: NEGATIVE
pH: 5

## 2012-08-08 MED ORDER — METRONIDAZOLE 500 MG PO TABS: 500.0000 mg | ORAL_TABLET | Freq: Two times a day (BID) | ORAL | Status: AC

## 2012-08-08 NOTE — Progress Notes (Signed)
GYN PROBLEM VISIT  Ms. April Valdez is a 27 y.o. year old female,G2P2002, who presents for a problem visit.   Subjective:  Pt c/o clear, watery vaginal discharge with odor and irritation since 08/05/12. Pt declines STD testing.   Reports noticed the change after using shower gel.  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  Denies douching, itching, pelvic pain, bleeding.   No OTC/RX medication.    Objective:  BP 114/62  Pulse 68  Wt 193 lb (87.544 kg)  BMI 37.69 kg/m2   GI: soft, non-tender; bowel sounds normal; no masses,  no organomegaly Hair distrubution even.  EGBUS: appears normal. Vulva and vagina appear normal. Bimanual exam reveals normal uterus and adnexa. Cervix: presence of blood from cervical os and friable, no lesions or tenderness.   Rectal: no lesions or hemorrids. Vagina: with large amount of yellowish, thin watery discharge in vault.  No odor Assessment:  Wet Mount: Postive for clue cells, negative for trich, BV   Plan:  Flagyl 500mg  PO BID x 7 days.  Return to office prn if symptoms worsen or fail to improve.   Elane Fritz, FNP-BC  08/08/2012 1:49 PM

## 2012-08-08 NOTE — Patient Instructions (Signed)
Bacterial Vaginosis Bacterial vaginosis (BV) is a vaginal infection where the normal balance of bacteria in the vagina is disrupted. The normal balance is then replaced by an overgrowth of certain bacteria. There are several different kinds of bacteria that can cause BV. BV is the most common vaginal infection in women of childbearing age. CAUSES   The cause of BV is not fully understood. BV develops when there is an increase or imbalance of harmful bacteria.  Some activities or behaviors can upset the normal balance of bacteria in the vagina and put women at increased risk including:  Having a new sex partner or multiple sex partners.  Douching.  Using an intrauterine device (IUD) for contraception.  It is not clear what role sexual activity plays in the development of BV. However, women that have never had sexual intercourse are rarely infected with BV. Women do not get BV from toilet seats, bedding, swimming pools or from touching objects around them.  SYMPTOMS   Grey vaginal discharge.  A fish-like odor with discharge, especially after sexual intercourse.  Itching or burning of the vagina and vulva.  Burning or pain with urination.  Some women have no signs or symptoms at all. DIAGNOSIS  Your caregiver must examine the vagina for signs of BV. Your caregiver will perform lab tests and look at the sample of vaginal fluid through a microscope. They will look for bacteria and abnormal cells (clue cells), a pH test higher than 4.5, and a positive amine test all associated with BV.  RISKS AND COMPLICATIONS   Pelvic inflammatory disease (PID).  Infections following gynecology surgery.  Developing HIV.  Developing herpes virus. TREATMENT  Sometimes BV will clear up without treatment. However, all women with symptoms of BV should be treated to avoid complications, especially if gynecology surgery is planned. Female partners generally do not need to be treated. However, BV may spread  between female sex partners so treatment is helpful in preventing a recurrence of BV.   BV may be treated with antibiotics. The antibiotics come in either pill or vaginal cream forms. Either can be used with nonpregnant or pregnant women, but the recommended dosages differ. These antibiotics are not harmful to the baby.  BV can recur after treatment. If this happens, a second round of antibiotics will often be prescribed.  Treatment is important for pregnant women. If not treated, BV can cause a premature delivery, especially for a pregnant woman who had a premature birth in the past. All pregnant women who have symptoms of BV should be checked and treated.  For chronic reoccurrence of BV, treatment with a type of prescribed gel vaginally twice a week is helpful. HOME CARE INSTRUCTIONS   Finish all medication as directed by your caregiver.  Do not have sex until treatment is completed.  Tell your sexual partner that you have a vaginal infection. They should see their caregiver and be treated if they have problems, such as a mild rash or itching.  Practice safe sex. Use condoms. Only have 1 sex partner. PREVENTION  Basic prevention steps can help reduce the risk of upsetting the natural balance of bacteria in the vagina and developing BV:  Do not have sexual intercourse (be abstinent).  Do not douche.  Use all of the medicine prescribed for treatment of BV, even if the signs and symptoms go away.  Tell your sex partner if you have BV. That way, they can be treated, if needed, to prevent reoccurrence. SEEK MEDICAL CARE IF:     Your symptoms are not improving after 3 days of treatment.  You have increased discharge, pain, or fever. MAKE SURE YOU:   Understand these instructions.  Will watch your condition.  Will get help right away if you are not doing well or get worse. FOR MORE INFORMATION  Division of STD Prevention (DSTDP), Centers for Disease Control and Prevention:  www.cdc.gov/std American [employer] Health Association (ASHA): www.ashastd.org  Document Released: 05/16/2005 Document Revised: 08/08/2011 Document Reviewed: 11/06/2008 [employer] Patient Information 2013 [employer].  

## 2013-08-23 ENCOUNTER — Encounter (HOSPITAL_BASED_OUTPATIENT_CLINIC_OR_DEPARTMENT_OTHER): Payer: Self-pay | Admitting: Emergency Medicine

## 2013-08-23 ENCOUNTER — Emergency Department (HOSPITAL_BASED_OUTPATIENT_CLINIC_OR_DEPARTMENT_OTHER)
Admission: EM | Admit: 2013-08-23 | Discharge: 2013-08-23 | Disposition: A | Discharge status: Home / Self Care | Payer: Managed Care, Other (non HMO) | Attending: Emergency Medicine | Admitting: Emergency Medicine

## 2013-08-23 DIAGNOSIS — B9789 Other viral agents as the cause of diseases classified elsewhere: Secondary | ICD-10-CM | POA: Insufficient documentation

## 2013-08-23 DIAGNOSIS — Z8679 Personal history of other diseases of the circulatory system: Secondary | ICD-10-CM | POA: Insufficient documentation

## 2013-08-23 DIAGNOSIS — B349 Viral infection, unspecified: Secondary | ICD-10-CM

## 2013-08-23 MED ORDER — HYDROCOD POLST-CHLORPHEN POLST 10-8 MG/5ML PO LQCR: 5.0000 mL | Freq: Two times a day (BID) | ORAL | Status: DC | PRN

## 2013-08-23 NOTE — ED Notes (Signed)
Sorerthroat, runny nose cough and left ear pain

## 2013-08-23 NOTE — ED Provider Notes (Signed)
CSN: [id number]     Arrival date & time 08/23/13  0618 History   None    Chief Complaint  Patient presents with  . Sore Throat     (Consider location/radiation/quality/duration/timing/severity/associated sxs/prior Treatment) HPI This is a 28 year old female with a two-day history of sore throat, nasal congestion, stuffy left ear and cough. She states her throat is "really sore" and worse with swallowing. She denies fever. Her cough is worsened this morning and she is now having hoarseness  Past Medical History  Diagnosis Date  . Headache, migraine    Past Surgical History  Procedure Laterality Date  . Tubal ligation    . Cesarean section      x2  . Appendectomy     Family History  Problem Relation Age of Onset  . Hypertension Maternal Grandfather   . Diabetes Maternal Grandfather   . Breast cancer Mother     early 27's  . Hypertension Maternal Uncle   . Hypertension Maternal Aunt   . Anemia Maternal Aunt     low iron  . Diabetes Maternal Aunt   . Stroke Maternal Uncle    History  Substance Use Topics  . Smoking status: Never Smoker   . Smokeless tobacco: Never Used  . Alcohol Use: No   OB History   Grav Para Term Preterm Abortions TAB SAB Ect Mult Living   2 2 2       2      Review of Systems  All other systems reviewed and are negative.   Allergies  Review of patient's allergies indicates no known allergies.  Home Medications  No current outpatient prescriptions on file. BP 108/70  Pulse 73  Temp(Src) 98.6 F (37 C) (Oral)  Ht 5' (1.524 m)  Wt 194 lb (87.998 kg)  BMI 37.89 kg/m2  SpO2 98%  Physical Exam General: Well-developed, well-nourished female in no acute distress; appearance consistent with age of record HENT: normocephalic; atraumatic; TMs normal; nasal congestion; pharyngeal erythema without exudate Eyes: pupils equal, round and reactive to light; extraocular muscles intact Neck: supple; no lymphadenopathy Heart: regular rate and rhythm;  no murmurs, rubs or gallops Lungs: clear to auscultation bilaterally Abdomen: soft; nondistended; nontender; no masses or hepatosplenomegaly; bowel sounds present Extremities: No deformity; full range of motion Neurologic: Awake, alert and oriented; motor function intact in all extremities and symmetric; no facial droop Skin: Warm and dry Psychiatric: Normal mood and affect    ED Course  Procedures (including critical care time)    MDM      Wynetta Fines, MD 08/23/13 [phone]

## 2013-08-23 NOTE — ED Notes (Signed)
sorethroat runny nose cough x 2 days

## 2013-11-26 ENCOUNTER — Encounter (HOSPITAL_BASED_OUTPATIENT_CLINIC_OR_DEPARTMENT_OTHER): Payer: Self-pay | Admitting: Emergency Medicine

## 2013-11-26 ENCOUNTER — Emergency Department (HOSPITAL_BASED_OUTPATIENT_CLINIC_OR_DEPARTMENT_OTHER)
Admission: EM | Admit: 2013-11-26 | Discharge: 2013-11-26 | Disposition: A | Discharge status: Home / Self Care | Payer: Managed Care, Other (non HMO) | Attending: Emergency Medicine | Admitting: Emergency Medicine

## 2013-11-26 DIAGNOSIS — R197 Diarrhea, unspecified: Secondary | ICD-10-CM | POA: Insufficient documentation

## 2013-11-26 DIAGNOSIS — G43109 Migraine with aura, not intractable, without status migrainosus: Secondary | ICD-10-CM | POA: Insufficient documentation

## 2013-11-26 MED ORDER — DEXAMETHASONE SODIUM PHOSPHATE 10 MG/ML IJ SOLN
10.0000 mg | Freq: Once | INTRAMUSCULAR | Status: AC
  Administered 2013-11-26: 10 mg via INTRAVENOUS
  Filled 2013-11-26: qty 1

## 2013-11-26 MED ORDER — SODIUM CHLORIDE 0.9 % IV BOLUS (SEPSIS)
1000.0000 mL | Freq: Once | INTRAVENOUS | Status: AC
  Administered 2013-11-26: 1000 mL via INTRAVENOUS

## 2013-11-26 MED ORDER — PROMETHAZINE HCL 25 MG/ML IJ SOLN
12.5000 mg | Freq: Once | INTRAMUSCULAR | Status: AC
  Administered 2013-11-26: 12.5 mg via INTRAVENOUS
  Filled 2013-11-26: qty 1

## 2013-11-26 MED ORDER — DIPHENHYDRAMINE HCL 50 MG/ML IJ SOLN
25.0000 mg | Freq: Once | INTRAMUSCULAR | Status: AC
Start: 2013-11-26 — End: 2013-11-26
  Administered 2013-11-26: 25 mg via INTRAVENOUS
  Filled 2013-11-26: qty 1

## 2013-11-26 MED ORDER — SODIUM CHLORIDE 0.9 % IV SOLN
INTRAVENOUS | Status: DC
  Administered 2013-11-26: 11:00:00 via INTRAVENOUS

## 2013-11-26 NOTE — Discharge Instructions (Signed)

## 2013-11-26 NOTE — ED Notes (Signed)
C/o mild h/a on Friday. Headache became worse yesterday with n/v/d. States h/a on forehead and eyes hurt.

## 2013-11-26 NOTE — ED Provider Notes (Addendum)
CSN: [id number]     Arrival date & time 11/26/13  1015 History   First MD Initiated Contact with Patient 11/26/13 1017     Chief Complaint  Patient presents with  . Migraine     (Consider location/radiation/quality/duration/timing/severity/associated sxs/prior Treatment) Patient is a 28 y.o. female presenting with migraines. The history is provided by the patient.  Migraine Associated symptoms include headaches. Pertinent negatives include no chest pain, no abdominal pain and no shortness of breath.   Patient with headache allover sharp in nature. Impression for 4 days. Similar to her past migraines. Has some mild photosensitivity and some nausea vomiting and some diarrhea. No history of numerous stools. Pain is 10 out of 10. Throbbing and sharp in nature. Nonradiating. Past Medical History  Diagnosis Date  . Headache, migraine    Past Surgical History  Procedure Laterality Date  . Tubal ligation    . Cesarean section      x2  . Appendectomy     Family History  Problem Relation Age of Onset  . Hypertension Maternal Grandfather   . Diabetes Maternal Grandfather   . Breast cancer Mother     early 44's  . Hypertension Maternal Uncle   . Hypertension Maternal Aunt   . Anemia Maternal Aunt     low iron  . Diabetes Maternal Aunt   . Stroke Maternal Uncle    History  Substance Use Topics  . Smoking status: Never Smoker   . Smokeless tobacco: Never Used  . Alcohol Use: No   OB History   Grav Para Term Preterm Abortions TAB SAB Ect Mult Living   2 2 2       2      Review of Systems  Constitutional: Negative for fever.  HENT: Negative for congestion.   Eyes: Positive for photophobia.  Respiratory: Negative for shortness of breath.   Cardiovascular: Negative for chest pain.  Gastrointestinal: Positive for nausea, vomiting and diarrhea. Negative for abdominal pain.  Genitourinary: Negative for dysuria.  Musculoskeletal: Negative for back pain.  Skin: Negative for rash.   Neurological: Positive for headaches.  Hematological: Does not bruise/bleed easily.  Psychiatric/Behavioral: Negative for confusion.      Allergies  Review of patient's allergies indicates no known allergies.  Home Medications   Prior to Admission medications   Medication Sig Start Date End Date Taking? Authorizing Provider  chlorpheniramine-HYDROcodone (TUSSIONEX PENNKINETIC ER) 10-8 MG/5ML LQCR Take 5 mLs by mouth every 12 (twelve) hours as needed (for cough or sore throat). 08/23/13   John L Molpus, MD   BP 121/59  Pulse 62  Temp(Src) 99.8 F (37.7 C) (Oral)  Resp 18  Ht 5' (1.524 m)  Wt 191 lb (86.637 kg)  BMI 37.30 kg/m2  SpO2 99%  LMP 11/21/2013 Physical Exam  Nursing note and vitals reviewed. Constitutional: She is oriented to person, place, and time. She appears well-developed and well-nourished.  HENT:  Head: Normocephalic and atraumatic.  Mouth/Throat: Oropharynx is clear and moist.  Eyes: Conjunctivae and EOM are normal. Pupils are equal, round, and reactive to light.  Neck: Normal range of motion.  Cardiovascular: Normal rate, regular rhythm and normal heart sounds.   Pulmonary/Chest: Effort normal and breath sounds normal. No respiratory distress.  Abdominal: Soft. Bowel sounds are normal. There is no tenderness.  Musculoskeletal: Normal range of motion.  Neurological: She is alert and oriented to person, place, and time. No cranial nerve deficit. She exhibits normal muscle tone. Coordination normal.  Skin: Skin is warm. She  is not diaphoretic.    ED Course  Procedures (including critical care time) Labs Review Labs Reviewed - No data to display  Imaging Review No results found.   EKG Interpretation None      MDM   Final diagnoses:  Migraine with aura and without status migrainosus, not intractable    Patient with history of migraines. This migraine headache was similar to her past migraines. Patient with improvement with migraine cocktail  Decadron Phenergan and Benadryl. Patient will be discharged home to rest in a dark room and work note provided. No complicating factors.    Fredia Sorrow, MD 11/26/13 1312  Fredia Sorrow, MD 11/26/13 [phone]

## 2014-03-31 ENCOUNTER — Encounter (HOSPITAL_BASED_OUTPATIENT_CLINIC_OR_DEPARTMENT_OTHER): Payer: Self-pay | Admitting: Emergency Medicine

## 2014-08-19 ENCOUNTER — Emergency Department (HOSPITAL_BASED_OUTPATIENT_CLINIC_OR_DEPARTMENT_OTHER): Payer: Managed Care, Other (non HMO)

## 2014-08-19 ENCOUNTER — Emergency Department (HOSPITAL_BASED_OUTPATIENT_CLINIC_OR_DEPARTMENT_OTHER)
Admission: EM | Admit: 2014-08-19 | Discharge: 2014-08-19 | Disposition: A | Discharge status: Home / Self Care | Payer: Managed Care, Other (non HMO) | Attending: Emergency Medicine | Admitting: Emergency Medicine

## 2014-08-19 ENCOUNTER — Encounter (HOSPITAL_BASED_OUTPATIENT_CLINIC_OR_DEPARTMENT_OTHER): Payer: Self-pay | Admitting: *Deleted

## 2014-08-19 DIAGNOSIS — R109 Unspecified abdominal pain: Secondary | ICD-10-CM

## 2014-08-19 DIAGNOSIS — N8320 Unspecified ovarian cysts: Secondary | ICD-10-CM | POA: Insufficient documentation

## 2014-08-19 DIAGNOSIS — N760 Acute vaginitis: Secondary | ICD-10-CM | POA: Insufficient documentation

## 2014-08-19 DIAGNOSIS — Z8679 Personal history of other diseases of the circulatory system: Secondary | ICD-10-CM | POA: Insufficient documentation

## 2014-08-19 DIAGNOSIS — Z3202 Encounter for pregnancy test, result negative: Secondary | ICD-10-CM | POA: Diagnosis not present

## 2014-08-19 DIAGNOSIS — Z79899 Other long term (current) drug therapy: Secondary | ICD-10-CM | POA: Insufficient documentation

## 2014-08-19 DIAGNOSIS — N83202 Unspecified ovarian cyst, left side: Secondary | ICD-10-CM

## 2014-08-19 DIAGNOSIS — Z791 Long term (current) use of non-steroidal anti-inflammatories (NSAID): Secondary | ICD-10-CM | POA: Insufficient documentation

## 2014-08-19 DIAGNOSIS — R102 Pelvic and perineal pain: Secondary | ICD-10-CM | POA: Insufficient documentation

## 2014-08-19 DIAGNOSIS — B9689 Other specified bacterial agents as the cause of diseases classified elsewhere: Secondary | ICD-10-CM

## 2014-08-19 LAB — WET PREP, GENITAL
TRICH WET PREP: NONE SEEN
YEAST WET PREP: NONE SEEN

## 2014-08-19 LAB — URINE MICROSCOPIC-ADD ON

## 2014-08-19 LAB — URINALYSIS, ROUTINE W REFLEX MICROSCOPIC
BILIRUBIN URINE: NEGATIVE
GLUCOSE, UA: NEGATIVE mg/dL
HGB URINE DIPSTICK: NEGATIVE
KETONES UR: NEGATIVE mg/dL
Nitrite: NEGATIVE
PROTEIN: NEGATIVE mg/dL
Specific Gravity, Urine: 1.017 (ref 1.005–1.030)
UROBILINOGEN UA: 0.2 mg/dL (ref 0.0–1.0)
pH: 6.5 (ref 5.0–8.0)

## 2014-08-19 LAB — PREGNANCY, URINE: Preg Test, Ur: NEGATIVE

## 2014-08-19 MED ORDER — IBUPROFEN 800 MG PO TABS
800.0000 mg | ORAL_TABLET | Freq: Once | ORAL | Status: AC
  Administered 2014-08-19: 800 mg via ORAL
  Filled 2014-08-19: qty 1

## 2014-08-19 MED ORDER — METRONIDAZOLE 500 MG PO TABS: 500.0000 mg | ORAL_TABLET | Freq: Two times a day (BID) | ORAL | Status: DC

## 2014-08-19 MED ORDER — NAPROXEN 500 MG PO TABS: 500.0000 mg | ORAL_TABLET | Freq: Two times a day (BID) | ORAL | Status: DC

## 2014-08-19 NOTE — Discharge Instructions (Signed)
Ovarian Cyst Follow up with your gynecologist. Return to the ED if you develop new or worsening symptoms. An ovarian cyst is a fluid-filled sac that forms on an ovary. The ovaries are small organs that produce eggs in women. Various types of cysts can form on the ovaries. Most are not cancerous. Many do not cause problems, and they often go away on their own. Some may cause symptoms and require treatment. Common types of ovarian cysts include:  Functional cysts--These cysts may occur every month during the menstrual cycle. This is normal. The cysts usually go away with the next menstrual cycle if the woman does not get pregnant. Usually, there are no symptoms with a functional cyst.  Endometrioma cysts--These cysts form from the tissue that lines the uterus. They are also called "chocolate cysts" because they become filled with blood that turns brown. This type of cyst can cause pain in the lower abdomen during intercourse and with your menstrual period.  Cystadenoma cysts--This type develops from the cells on the outside of the ovary. These cysts can get very big and cause lower abdomen pain and pain with intercourse. This type of cyst can twist on itself, cut off its blood supply, and cause severe pain. It can also easily rupture and cause a lot of pain.  Dermoid cysts--This type of cyst is sometimes found in both ovaries. These cysts may contain different kinds of body tissue, such as skin, teeth, hair, or cartilage. They usually do not cause symptoms unless they get very big.  Theca lutein cysts--These cysts occur when too much of a certain hormone (human chorionic gonadotropin) is produced and overstimulates the ovaries to produce an egg. This is most common after procedures used to assist with the conception of a baby (in vitro fertilization). CAUSES   Fertility drugs can cause a condition in which multiple large cysts are formed on the ovaries. This is called ovarian hyperstimulation  syndrome.  A condition called polycystic ovary syndrome can cause hormonal imbalances that can lead to nonfunctional ovarian cysts. SIGNS AND SYMPTOMS  Many ovarian cysts do not cause symptoms. If symptoms are present, they may include:  Pelvic pain or pressure.  Pain in the lower abdomen.  Pain during sexual intercourse.  Increasing girth (swelling) of the abdomen.  Abnormal menstrual periods.  Increasing pain with menstrual periods.  Stopping having menstrual periods without being pregnant. DIAGNOSIS  These cysts are commonly found during a routine or annual pelvic exam. Tests may be ordered to find out more about the cyst. These tests may include:  Ultrasound.  X-ray of the pelvis.  CT scan.  MRI.  Blood tests. TREATMENT  Many ovarian cysts go away on their own without treatment. Your health care provider may want to check your cyst regularly for 2-3 months to see if it changes. For women in menopause, it is particularly important to monitor a cyst closely because of the higher rate of ovarian cancer in menopausal women. When treatment is needed, it may include any of the following:  A procedure to drain the cyst (aspiration). This may be done using a long needle and ultrasound. It can also be done through a laparoscopic procedure. This involves using a thin, lighted tube with a tiny camera on the end (laparoscope) inserted through a small incision.  Surgery to remove the whole cyst. This may be done using laparoscopic surgery or an open surgery involving a larger incision in the lower abdomen.  Hormone treatment or birth control pills. These methods  are sometimes used to help dissolve a cyst. HOME CARE INSTRUCTIONS   Only take over-the-counter or prescription medicines as directed by your health care provider.  Follow up with your health care provider as directed.  Get regular pelvic exams and Pap tests. SEEK MEDICAL CARE IF:   Your periods are late, irregular, or  painful, or they stop.  Your pelvic pain or abdominal pain does not go away.  Your abdomen becomes larger or swollen.  You have pressure on your bladder or trouble emptying your bladder completely.  You have pain during sexual intercourse.  You have feelings of fullness, pressure, or discomfort in your stomach.  You lose weight for no apparent reason.  You feel generally ill.  You become constipated.  You lose your appetite.  You develop acne.  You have an increase in body and facial hair.  You are gaining weight, without changing your exercise and eating habits.  You think you are pregnant. SEEK IMMEDIATE MEDICAL CARE IF:   You have increasing abdominal pain.  You feel sick to your stomach (nauseous), and you throw up (vomit).  You develop a fever that comes on suddenly.  You have abdominal pain during a bowel movement.  Your menstrual periods become heavier than usual. MAKE SURE YOU:  Understand these instructions.  Will watch your condition.  Will get help right away if you are not doing well or get worse. Document Released: 05/16/2005 Document Revised: 05/21/2013 Document Reviewed: 01/21/2013 [facility] Patient Information 2015 [place], [place]. This information is not intended to replace advice given to you by your health care provider. Make sure you discuss any questions you have with your health care provider.

## 2014-08-19 NOTE — ED Notes (Signed)
C/o cramps in bil lower abd pain and mid low back onset yesterday. No burning or frequent urination. No n/v. Last nl BM was yesterday.

## 2014-08-19 NOTE — ED Provider Notes (Signed)
CSN: [id number]     Arrival date & time 08/19/14  7412 History  This chart was scribed for Ezequiel Essex, MD by Ludger Nutting, ED Scribe. This patient was seen in room MH05/MH05 and the patient's care was started 9:15 AM.    Chief Complaint  Patient presents with  . Abdominal Pain   The history is provided by the patient. No language interpreter was used.     HPI Comments: ADIBA FARGNOLI is a 29 y.o. female who presents to the Emergency Department complaining of constant, unchanged lower abdominal pain that began last night. She describes the pain as cramping/contractions and states it radiates to her lower back. She states laying on her side aggravates the pain. She reports history of tubal ligation, 2 cesarean sections, and an appendectomy. She denies dysuria, hematuria, vaginal discharge, vaginal bleeding, fever, nausea, diarrhea.   LMP end of February   Past Medical History  Diagnosis Date  . Headache, migraine    Past Surgical History  Procedure Laterality Date  . Tubal ligation    . Cesarean section      x2  . Appendectomy     Family History  Problem Relation Age of Onset  . Hypertension Maternal Grandfather   . Diabetes Maternal Grandfather   . Breast cancer Mother     early 49's  . Hypertension Maternal Uncle   . Hypertension Maternal Aunt   . Anemia Maternal Aunt     low iron  . Diabetes Maternal Aunt   . Stroke Maternal Uncle    History  Substance Use Topics  . Smoking status: Never Smoker   . Smokeless tobacco: Never Used  . Alcohol Use: No   OB History    Gravida Para Term Preterm AB TAB SAB Ectopic Multiple Living   2 2 2       2      Review of Systems  A complete 10 system review of systems was obtained and all systems are negative except as noted in the HPI and PMH.    Allergies  Review of patient's allergies indicates no known allergies.  Home Medications   Prior to Admission medications   Medication Sig Start Date End Date Taking? Authorizing  Provider  chlorpheniramine-HYDROcodone (TUSSIONEX PENNKINETIC ER) 10-8 MG/5ML LQCR Take 5 mLs by mouth every 12 (twelve) hours as needed (for cough or sore throat). 08/23/13   John Molpus, MD  metroNIDAZOLE (FLAGYL) 500 MG tablet Take 1 tablet (500 mg total) by mouth 2 (two) times daily. 08/19/14   Ezequiel Essex, MD  naproxen (NAPROSYN) 500 MG tablet Take 1 tablet (500 mg total) by mouth 2 (two) times daily. 08/19/14   Ezequiel Essex, MD   BP 110/59 mmHg  Pulse 80  Temp(Src) 98.9 F (37.2 C) (Oral)  Resp 18  Ht 5' (1.524 m)  Wt 194 lb (87.998 kg)  BMI 37.89 kg/m2  SpO2 100%  LMP 07/24/2014 Physical Exam  Constitutional: She is oriented to person, place, and time. She appears well-developed and well-nourished. No distress.  HENT:  Head: Normocephalic and atraumatic.  Mouth/Throat: Oropharynx is clear and moist. No oropharyngeal exudate.  Eyes: Conjunctivae and EOM are normal. Pupils are equal, round, and reactive to light.  Neck: Normal range of motion. Neck supple.  No meningismus.  Cardiovascular: Normal rate, regular rhythm, normal heart sounds and intact distal pulses.   No murmur heard. Pulmonary/Chest: Effort normal and breath sounds normal. No respiratory distress.  Abdominal: Soft. There is tenderness. There is no rebound and  no guarding.  Diffuse lower abdominal tenderness and LLQ tenderness. Worse suprapubic and LLQ pain  Genitourinary: Vaginal discharge found.  Left CVA tenderness  Chaperone present. Normal external genitalia. White vaginal discharge in vault. No CMT. Diffuse tenderness over her uterus and bilateral adnexa  Musculoskeletal: Normal range of motion. She exhibits no edema or tenderness.  Neurological: She is alert and oriented to person, place, and time. No cranial nerve deficit. She exhibits normal muscle tone. Coordination normal.  No ataxia on finger to nose bilaterally. No pronator drift. 5/5 strength throughout. CN 2-12 intact. Negative Romberg. Equal  grip strength. Sensation intact. Gait is normal.   Skin: Skin is warm.  Psychiatric: She has a normal mood and affect. Her behavior is normal.  Nursing note and vitals reviewed.   ED Course  Procedures (including critical care time)  DIAGNOSTIC STUDIES: Oxygen Saturation is 100% on RA, normal by my interpretation.    COORDINATION OF CARE: 9:18 AM Discussed treatment plan with pt at bedside and pt agreed to plan.   Labs Review Labs Reviewed  WET PREP, GENITAL - Abnormal; Notable for the following:    Clue Cells Wet Prep HPF POC FEW (*)    WBC, Wet Prep HPF POC MODERATE (*)    All other components within normal limits  URINALYSIS, ROUTINE W REFLEX MICROSCOPIC - Abnormal; Notable for the following:    Leukocytes, UA TRACE (*)    All other components within normal limits  URINE MICROSCOPIC-ADD ON - Abnormal; Notable for the following:    Squamous Epithelial / LPF FEW (*)    Bacteria, UA FEW (*)    All other components within normal limits  PREGNANCY, URINE  GC/CHLAMYDIA PROBE AMP ([place])    Imaging Review US Transvaginal Non-ob  08/19/2014   CLINICAL DATA:  Pelvic pain.  EXAM: TRANSABDOMINAL AND TRANSVAGINAL ULTRASOUND OF PELVIS  DOPPLER ULTRASOUND OF OVARIES  TECHNIQUE: Both transabdominal and transvaginal ultrasound examinations of the pelvis were performed. Transabdominal technique was performed for global imaging of the pelvis including uterus, ovaries, adnexal regions, and pelvic cul-de-sac.  It was necessary to proceed with endovaginal exam following the transabdominal exam to visualize the endometrium. Color and duplex Doppler ultrasound was utilized to evaluate blood flow to the ovaries.  COMPARISON:  Ultrasound dated 03/12/2008  FINDINGS: Uterus  Measurements: 12.4 x 5.2 x 5.7 cm. 2 cm fibroid in the anterior aspect of the uterine fundus.  Endometrium  Thickness: 11 mm.  No focal abnormality visualized.  Right ovary  Measurements: 4.1 x 1.9 x 4.3 cm. Normal appearance/no  adnexal mass.  Left ovary  Measurements: 5.5 x 2.8 x 3.3 cm. 2.5 cm slightly irregular hypoechoic structure with some internal echoes, probably representing a small hemorrhagic cyst.  Pulsed Doppler evaluation of both ovaries demonstrates normal low-resistance arterial and venous waveforms.  Other findings  Trace free fluid.  Nabothian cysts in the cervix.  IMPRESSION: Small hemorrhagic cyst in the left ovary. 2 cm cyst in the anterior aspect of the fundus of the uterus. Otherwise, normal exam. Normal perfusion to the ovaries.   Electronically Signed   By: Lorriane Shire M.D.   On: 08/19/2014 11:01   US Pelvis Complete  08/19/2014   CLINICAL DATA:  Pelvic pain.  EXAM: TRANSABDOMINAL AND TRANSVAGINAL ULTRASOUND OF PELVIS  DOPPLER ULTRASOUND OF OVARIES  TECHNIQUE: Both transabdominal and transvaginal ultrasound examinations of the pelvis were performed. Transabdominal technique was performed for global imaging of the pelvis including uterus, ovaries, adnexal regions, and pelvic cul-de-sac.  It was necessary to proceed with endovaginal exam following the transabdominal exam to visualize the endometrium. Color and duplex Doppler ultrasound was utilized to evaluate blood flow to the ovaries.  COMPARISON:  Ultrasound dated 03/12/2008  FINDINGS: Uterus  Measurements: 12.4 x 5.2 x 5.7 cm. 2 cm fibroid in the anterior aspect of the uterine fundus.  Endometrium  Thickness: 11 mm.  No focal abnormality visualized.  Right ovary  Measurements: 4.1 x 1.9 x 4.3 cm. Normal appearance/no adnexal mass.  Left ovary  Measurements: 5.5 x 2.8 x 3.3 cm. 2.5 cm slightly irregular hypoechoic structure with some internal echoes, probably representing a small hemorrhagic cyst.  Pulsed Doppler evaluation of both ovaries demonstrates normal low-resistance arterial and venous waveforms.  Other findings  Trace free fluid.  Nabothian cysts in the cervix.  IMPRESSION: Small hemorrhagic cyst in the left ovary. 2 cm cyst in the anterior aspect of  the fundus of the uterus. Otherwise, normal exam. Normal perfusion to the ovaries.   Electronically Signed   By: Lorriane Shire M.D.   On: 08/19/2014 11:01   [place] Art/ven Flow Abd Pelv Doppler  08/19/2014   CLINICAL DATA:  Pelvic pain.  EXAM: TRANSABDOMINAL AND TRANSVAGINAL ULTRASOUND OF PELVIS  DOPPLER ULTRASOUND OF OVARIES  TECHNIQUE: Both transabdominal and transvaginal ultrasound examinations of the pelvis were performed. Transabdominal technique was performed for global imaging of the pelvis including uterus, ovaries, adnexal regions, and pelvic cul-de-sac.  It was necessary to proceed with endovaginal exam following the transabdominal exam to visualize the endometrium. Color and duplex Doppler ultrasound was utilized to evaluate blood flow to the ovaries.  COMPARISON:  Ultrasound dated 03/12/2008  FINDINGS: Uterus  Measurements: 12.4 x 5.2 x 5.7 cm. 2 cm fibroid in the anterior aspect of the uterine fundus.  Endometrium  Thickness: 11 mm.  No focal abnormality visualized.  Right ovary  Measurements: 4.1 x 1.9 x 4.3 cm. Normal appearance/no adnexal mass.  Left ovary  Measurements: 5.5 x 2.8 x 3.3 cm. 2.5 cm slightly irregular hypoechoic structure with some internal echoes, probably representing a small hemorrhagic cyst.  Pulsed Doppler evaluation of both ovaries demonstrates normal low-resistance arterial and venous waveforms.  Other findings  Trace free fluid.  Nabothian cysts in the cervix.  IMPRESSION: Small hemorrhagic cyst in the left ovary. 2 cm cyst in the anterior aspect of the fundus of the uterus. Otherwise, normal exam. Normal perfusion to the ovaries.   Electronically Signed   By: Lorriane Shire M.D.   On: 08/19/2014 11:01     EKG Interpretation None      MDM   Final diagnoses:  Pelvic pain in female  Cyst of left ovary  Bacterial vaginosis   Pelvic pain since yesterday radiating to her low back. No urinary or vaginal symptoms.  Urinalysis negative. Prevacid as negative. Pelvic  exam as above.  Ultrasound shows hemorrhagic cyst in left ovary as well as cyst of the uterus. No torsion.  Pain is improved. Patient is stable for follow-up with GYN. Treat with anti-inflammatories, treat bacterial vaginosis. Return precautions discussed.  I personally performed the services described in this documentation, which was scribed in my presence. The recorded information has been reviewed and is accurate.   Ezequiel Essex, MD 08/19/14 [phone]

## 2014-08-20 LAB — GC/CHLAMYDIA PROBE AMP (~~LOC~~) NOT AT ARMC
Chlamydia: NEGATIVE
NEISSERIA GONORRHEA: NEGATIVE

## 2014-08-21 ENCOUNTER — Other Ambulatory Visit: Payer: Self-pay | Admitting: Obstetrics and Gynecology

## 2014-08-21 DIAGNOSIS — Z1231 Encounter for screening mammogram for malignant neoplasm of breast: Secondary | ICD-10-CM

## 2014-08-21 DIAGNOSIS — Z803 Family history of malignant neoplasm of breast: Secondary | ICD-10-CM

## 2014-09-03 ENCOUNTER — Ambulatory Visit: Payer: Self-pay

## 2014-09-09 ENCOUNTER — Ambulatory Visit: Payer: Self-pay

## 2014-09-10 ENCOUNTER — Ambulatory Visit
Admission: RE | Admit: 2014-09-10 | Discharge: 2014-09-10 | Disposition: A | Discharge status: Home / Self Care | Payer: Commercial Indemnity | Source: Ambulatory Visit | Attending: Obstetrics and Gynecology | Admitting: Obstetrics and Gynecology

## 2014-09-10 DIAGNOSIS — Z803 Family history of malignant neoplasm of breast: Secondary | ICD-10-CM

## 2014-09-10 DIAGNOSIS — Z1231 Encounter for screening mammogram for malignant neoplasm of breast: Secondary | ICD-10-CM

## 2014-09-11 ENCOUNTER — Other Ambulatory Visit: Payer: Self-pay | Admitting: Obstetrics and Gynecology

## 2014-09-11 DIAGNOSIS — R928 Other abnormal and inconclusive findings on diagnostic imaging of breast: Secondary | ICD-10-CM

## 2014-09-17 ENCOUNTER — Ambulatory Visit
Admission: RE | Admit: 2014-09-17 | Discharge: 2014-09-17 | Disposition: A | Discharge status: Home / Self Care | Payer: Commercial Indemnity | Source: Ambulatory Visit | Attending: Obstetrics and Gynecology | Admitting: Obstetrics and Gynecology

## 2014-09-17 ENCOUNTER — Other Ambulatory Visit: Payer: Self-pay | Admitting: Obstetrics and Gynecology

## 2014-09-17 DIAGNOSIS — R928 Other abnormal and inconclusive findings on diagnostic imaging of breast: Secondary | ICD-10-CM

## 2014-09-17 DIAGNOSIS — R921 Mammographic calcification found on diagnostic imaging of breast: Secondary | ICD-10-CM

## 2014-09-18 ENCOUNTER — Other Ambulatory Visit: Payer: Self-pay | Admitting: Obstetrics and Gynecology

## 2014-09-18 DIAGNOSIS — R921 Mammographic calcification found on diagnostic imaging of breast: Secondary | ICD-10-CM

## 2014-09-23 ENCOUNTER — Ambulatory Visit
Admission: RE | Admit: 2014-09-23 | Discharge: 2014-09-23 | Disposition: A | Discharge status: Home / Self Care | Payer: Commercial Indemnity | Source: Ambulatory Visit | Attending: Obstetrics and Gynecology | Admitting: Obstetrics and Gynecology

## 2014-09-23 DIAGNOSIS — R921 Mammographic calcification found on diagnostic imaging of breast: Secondary | ICD-10-CM

## 2015-01-21 ENCOUNTER — Emergency Department (HOSPITAL_BASED_OUTPATIENT_CLINIC_OR_DEPARTMENT_OTHER)
Admission: EM | Admit: 2015-01-21 | Discharge: 2015-01-21 | Disposition: A | Discharge status: Home / Self Care | Payer: Managed Care, Other (non HMO) | Attending: Emergency Medicine | Admitting: Emergency Medicine

## 2015-01-21 ENCOUNTER — Encounter (HOSPITAL_BASED_OUTPATIENT_CLINIC_OR_DEPARTMENT_OTHER): Payer: Self-pay

## 2015-01-21 DIAGNOSIS — Z8679 Personal history of other diseases of the circulatory system: Secondary | ICD-10-CM | POA: Diagnosis not present

## 2015-01-21 DIAGNOSIS — N939 Abnormal uterine and vaginal bleeding, unspecified: Secondary | ICD-10-CM | POA: Diagnosis not present

## 2015-01-21 DIAGNOSIS — Z3202 Encounter for pregnancy test, result negative: Secondary | ICD-10-CM | POA: Diagnosis not present

## 2015-01-21 LAB — URINALYSIS, ROUTINE W REFLEX MICROSCOPIC
Glucose, UA: NEGATIVE mg/dL
Ketones, ur: 15 mg/dL — AB
Nitrite: NEGATIVE
Protein, ur: NEGATIVE mg/dL
SPECIFIC GRAVITY, URINE: 1.027 (ref 1.005–1.030)
Urobilinogen, UA: 0.2 mg/dL (ref 0.0–1.0)
pH: 5.5 (ref 5.0–8.0)

## 2015-01-21 LAB — CBC
HCT: 37.4 % (ref 36.0–46.0)
Hemoglobin: 12.2 g/dL (ref 12.0–15.0)
MCH: 26.9 pg (ref 26.0–34.0)
MCHC: 32.6 g/dL (ref 30.0–36.0)
MCV: 82.6 fL (ref 78.0–100.0)
PLATELETS: 241 10*3/uL (ref 150–400)
RBC: 4.53 MIL/uL (ref 3.87–5.11)
RDW: 13.2 % (ref 11.5–15.5)
WBC: 4.6 10*3/uL (ref 4.0–10.5)

## 2015-01-21 LAB — COMPREHENSIVE METABOLIC PANEL
ALT: 15 U/L (ref 14–54)
AST: 15 U/L (ref 15–41)
Albumin: 4.4 g/dL (ref 3.5–5.0)
Alkaline Phosphatase: 46 U/L (ref 38–126)
Anion gap: 6 (ref 5–15)
BUN: 12 mg/dL (ref 6–20)
CALCIUM: 9.3 mg/dL (ref 8.9–10.3)
CHLORIDE: 106 mmol/L (ref 101–111)
CO2: 27 mmol/L (ref 22–32)
Creatinine, Ser: 0.68 mg/dL (ref 0.44–1.00)
GFR calc non Af Amer: >60 mL/min (ref >60)
Glucose, Bld: 92 mg/dL (ref 65–99)
POTASSIUM: 3.8 mmol/L (ref 3.5–5.1)
SODIUM: 139 mmol/L (ref 135–145)
Total Bilirubin: 0.4 mg/dL (ref 0.3–1.2)
Total Protein: 7.8 g/dL (ref 6.5–8.1)

## 2015-01-21 LAB — URINE MICROSCOPIC-ADD ON

## 2015-01-21 LAB — PREGNANCY, URINE: PREG TEST UR: NEGATIVE

## 2015-01-21 MED ORDER — PROCHLORPERAZINE EDISYLATE 5 MG/ML IJ SOLN
10.0000 mg | Freq: Once | INTRAMUSCULAR | Status: AC
  Administered 2015-01-21: 10 mg via INTRAMUSCULAR
  Filled 2015-01-21: qty 2

## 2015-01-21 MED ORDER — KETOROLAC TROMETHAMINE 60 MG/2ML IM SOLN
60.0000 mg | Freq: Once | INTRAMUSCULAR | Status: AC
  Administered 2015-01-21: 60 mg via INTRAMUSCULAR
  Filled 2015-01-21: qty 2

## 2015-01-21 NOTE — ED Provider Notes (Signed)
CSN: [id number]     Arrival date & time 01/21/15  1644 History  This chart was scribed for Dorie Rank, MD by Julien Nordmann, ED Scribe. This patient was seen in room MH09/MH09 and the patient's care was started at 7:02 PM.    Chief Complaint  Patient presents with  . Vaginal Bleeding     The history is provided by the patient. No language interpreter was used.   HPI Comments: April Valdez is a 29 y.o. female who presents to the Emergency Department complaining of constant, gradual worsening vaginal bleeding onset 4 days ago. She has been having associated headache, fatigue, and lower abdominal pain as well. Pt notes she has been bleeding very heavily and has been going through pads every 30 minutes. She called her ob/gyn but was unable to be seen. Pt notes coming off of her period on 8/2 and coming back on very heavily on 8/21. Pt states she usually does not have a period very often but has been having a menstrual cycle every month since June. Pt denies fevers, LOC, and hx of heavy or irregular periods.  Past Medical History  Diagnosis Date  . Headache, migraine    Past Surgical History  Procedure Laterality Date  . Tubal ligation    . Cesarean section      x2  . Appendectomy     Family History  Problem Relation Age of Onset  . Hypertension Maternal Grandfather   . Diabetes Maternal Grandfather   . Breast cancer Mother     early 38's  . Hypertension Maternal Uncle   . Hypertension Maternal Aunt   . Anemia Maternal Aunt     low iron  . Diabetes Maternal Aunt   . Stroke Maternal Uncle    Social History  Substance Use Topics  . Smoking status: Never Smoker   . Smokeless tobacco: Never Used  . Alcohol Use: No   OB History    Gravida Para Term Preterm AB TAB SAB Ectopic Multiple Living   2 2 2       2      Review of Systems  Constitutional: Negative for fever.  Gastrointestinal: Positive for abdominal pain.  Genitourinary: Positive for vaginal bleeding.  All other  systems reviewed and are negative.     Allergies  Review of patient's allergies indicates no known allergies.  Home Medications   Prior to Admission medications   Not on File   Triage vitals: BP 118/53 mmHg  Pulse 61  Temp(Src) 99.2 F (37.3 C) (Oral)  Resp 18  Ht 5' (1.524 m)  Wt 196 lb (88.905 kg)  BMI 38.28 kg/m2  SpO2 99%  LMP 01/18/2015 Physical Exam  Constitutional: She appears well-developed and well-nourished. No distress.  HENT:  Head: Normocephalic and atraumatic.  Right Ear: External ear normal.  Left Ear: External ear normal.  Eyes: Conjunctivae are normal. Right eye exhibits no discharge. Left eye exhibits no discharge. No scleral icterus.  Neck: Neck supple. No tracheal deviation present.  Cardiovascular: Normal rate, regular rhythm and intact distal pulses.   Pulmonary/Chest: Effort normal and breath sounds normal. No stridor. No respiratory distress. She has no wheezes. She has no rales.  Abdominal: Soft. Bowel sounds are normal. She exhibits no distension. There is no tenderness. There is no rebound and no guarding.  Genitourinary: Uterus normal. Pelvic exam was performed with patient supine. There is no rash, tenderness or lesion on the right labia. There is no rash, tenderness or lesion on the  left labia. Cervix exhibits no motion tenderness and no discharge. Right adnexum displays no mass, no tenderness and no fullness. Left adnexum displays no mass, no tenderness and no fullness. There is bleeding in the vagina.  Musculoskeletal: She exhibits no edema or tenderness.  Neurological: She is alert. She has normal strength. No cranial nerve deficit (no facial droop, extraocular movements intact, no slurred speech) or sensory deficit. She exhibits normal muscle tone. She displays no seizure activity. Coordination normal.  Skin: Skin is warm and dry. No rash noted.  Psychiatric: She has a normal mood and affect.  Nursing note and vitals reviewed.   ED Course   Procedures  DIAGNOSTIC STUDIES: Oxygen Saturation is 99% on RA, normal by my interpretation.  COORDINATION OF CARE:  7:05 PM Discussed treatment plan which includes lab work, pelvic exam with pt at bedside and pt agreed to plan.  Labs Review Labs Reviewed  URINALYSIS, ROUTINE W REFLEX MICROSCOPIC (NOT AT [facility]) - Abnormal; Notable for the following:    Color, Urine AMBER (*)    APPearance CLOUDY (*)    Hgb urine dipstick LARGE (*)    Bilirubin Urine SMALL (*)    Ketones, ur 15 (*)    Leukocytes, UA SMALL (*)    All other components within normal limits  URINE MICROSCOPIC-ADD ON - Abnormal; Notable for the following:    Squamous Epithelial / LPF FEW (*)    Bacteria, UA FEW (*)    All other components within normal limits  PREGNANCY, URINE  CBC  COMPREHENSIVE METABOLIC PANEL  RPR  HIV ANTIBODY (ROUTINE TESTING)  GC/CHLAMYDIA PROBE AMP ([place]) NOT AT [facility]      MDM   Final diagnoses:  Abnormal uterine bleeding   Patient has no evidence of active bleeding on my exams. She primarily has old blood in the vaginal vault. Patient does have history of irregular menstrual periods. Currently, she is not having any significant amount of bleeding on exam. She is not anemic. I think she can follow-up with her GYN doctor.  Patient also happened to mention she was having trouble with a migraine headache at the end of my exam. He does have a history of these. She was given an injection of Compazine and Toradol for her pain.  I personally performed the services described in this documentation, which was scribed in my presence.  The recorded information has been reviewed and is accurate.    Dorie Rank, MD 01/21/15 2028

## 2015-01-21 NOTE — ED Notes (Signed)
Vaginal bleeding since Sunday-LMP was 7/26 -8/2

## 2015-01-21 NOTE — Discharge Instructions (Signed)
Abnormal Uterine Bleeding Abnormal uterine bleeding can affect women at various stages in life, including teenagers, women in their reproductive years, pregnant women, and women who have reached menopause. Several kinds of uterine bleeding are considered abnormal, including:  Bleeding or spotting between periods.   Bleeding after sexual intercourse.   Bleeding that is heavier or more than normal.   Periods that last longer than usual.  Bleeding after menopause.  Many cases of abnormal uterine bleeding are minor and simple to treat, while others are more serious. Any type of abnormal bleeding should be evaluated by your health care provider. Treatment will depend on the cause of the bleeding. HOME CARE INSTRUCTIONS Monitor your condition for any changes. The following actions may help to alleviate any discomfort you are experiencing:  Avoid the use of tampons and douches as directed by your health care provider.  Change your pads frequently. You should get regular pelvic exams and Pap tests. Keep all follow-up appointments for diagnostic tests as directed by your health care provider.  SEEK MEDICAL CARE IF:   Your bleeding lasts more than 1 week.   You feel dizzy at times.  SEEK IMMEDIATE MEDICAL CARE IF:   You pass out.   You are changing pads every 15 to 30 minutes.   You have abdominal pain.  You have a fever.   You become sweaty or weak.   You are passing large blood clots from the vagina.   You start to feel nauseous and vomit. MAKE SURE YOU:   Understand these instructions.  Will watch your condition.  Will get help right away if you are not doing well or get worse. Document Released: 05/16/2005 Document Revised: 05/21/2013 Document Reviewed: 12/13/2012 Exit[employer] Patient Information 2015 [employer]. This information is not intended to replace advice given to you by your health care provider. Make sure you discuss any questions you have with your  health care provider.  

## 2015-01-22 LAB — GC/CHLAMYDIA PROBE AMP (~~LOC~~) NOT AT ARMC
Chlamydia: NEGATIVE
Neisseria Gonorrhea: NEGATIVE

## 2015-01-23 LAB — HIV ANTIBODY (ROUTINE TESTING W REFLEX): HIV Screen 4th Generation wRfx: NONREACTIVE

## 2015-01-23 LAB — RPR: RPR: NONREACTIVE

## 2015-09-07 ENCOUNTER — Ambulatory Visit (INDEPENDENT_AMBULATORY_CARE_PROVIDER_SITE_OTHER): Payer: Managed Care, Other (non HMO)

## 2015-09-07 ENCOUNTER — Encounter: Payer: Managed Care, Other (non HMO) | Admitting: Podiatry

## 2015-09-07 DIAGNOSIS — M722 Plantar fascial fibromatosis: Secondary | ICD-10-CM | POA: Diagnosis not present

## 2015-09-17 ENCOUNTER — Ambulatory Visit: Payer: Managed Care, Other (non HMO) | Admitting: Podiatry

## 2015-10-28 NOTE — Progress Notes (Signed)
This encounter was created in error - please disregard.

## 2015-12-06 ENCOUNTER — Encounter (HOSPITAL_BASED_OUTPATIENT_CLINIC_OR_DEPARTMENT_OTHER): Payer: Self-pay | Admitting: *Deleted

## 2015-12-06 ENCOUNTER — Emergency Department (HOSPITAL_BASED_OUTPATIENT_CLINIC_OR_DEPARTMENT_OTHER)
Admission: EM | Admit: 2015-12-06 | Discharge: 2015-12-06 | Disposition: A | Discharge status: Home / Self Care | Payer: Managed Care, Other (non HMO) | Attending: Emergency Medicine | Admitting: Emergency Medicine

## 2015-12-06 DIAGNOSIS — N939 Abnormal uterine and vaginal bleeding, unspecified: Secondary | ICD-10-CM | POA: Diagnosis present

## 2015-12-06 DIAGNOSIS — R42 Dizziness and giddiness: Secondary | ICD-10-CM | POA: Diagnosis not present

## 2015-12-06 DIAGNOSIS — N924 Excessive bleeding in the premenopausal period: Secondary | ICD-10-CM | POA: Insufficient documentation

## 2015-12-06 DIAGNOSIS — R5383 Other fatigue: Secondary | ICD-10-CM | POA: Diagnosis not present

## 2015-12-06 DIAGNOSIS — N921 Excessive and frequent menstruation with irregular cycle: Secondary | ICD-10-CM

## 2015-12-06 LAB — CBC WITH DIFFERENTIAL/PLATELET
Basophils Absolute: 0 10*3/uL (ref 0.0–0.1)
Basophils Relative: 0 %
EOS ABS: 0.1 10*3/uL (ref 0.0–0.7)
Eosinophils Relative: 1 %
HEMATOCRIT: 33.4 % — AB (ref 36.0–46.0)
HEMOGLOBIN: 11 g/dL — AB (ref 12.0–15.0)
LYMPHS ABS: 2 10*3/uL (ref 0.7–4.0)
Lymphocytes Relative: 43 %
MCH: 26.9 pg (ref 26.0–34.0)
MCHC: 32.9 g/dL (ref 30.0–36.0)
MCV: 81.7 fL (ref 78.0–100.0)
Monocytes Absolute: 0.3 10*3/uL (ref 0.1–1.0)
Monocytes Relative: 7 %
NEUTROS ABS: 2.2 10*3/uL (ref 1.7–7.7)
NEUTROS PCT: 49 %
Platelets: 227 10*3/uL (ref 150–400)
RBC: 4.09 MIL/uL (ref 3.87–5.11)
RDW: 13.4 % (ref 11.5–15.5)
WBC: 4.6 10*3/uL (ref 4.0–10.5)

## 2015-12-06 LAB — WET PREP, GENITAL
CLUE CELLS WET PREP: NONE SEEN
Sperm: NONE SEEN
TRICH WET PREP: NONE SEEN
Yeast Wet Prep HPF POC: NONE SEEN

## 2015-12-06 LAB — BASIC METABOLIC PANEL
ANION GAP: 4 — AB (ref 5–15)
BUN: 14 mg/dL (ref 6–20)
CHLORIDE: 105 mmol/L (ref 101–111)
CO2: 26 mmol/L (ref 22–32)
CREATININE: 0.57 mg/dL (ref 0.44–1.00)
Calcium: 8.8 mg/dL — ABNORMAL LOW (ref 8.9–10.3)
GFR calc non Af Amer: >60 mL/min (ref >60)
Glucose, Bld: 100 mg/dL — ABNORMAL HIGH (ref 65–99)
POTASSIUM: 3.7 mmol/L (ref 3.5–5.1)
SODIUM: 135 mmol/L (ref 135–145)

## 2015-12-06 LAB — PREGNANCY, URINE: Preg Test, Ur: NEGATIVE

## 2015-12-06 MED ORDER — NORETHINDRONE ACETATE 5 MG PO TABS: 5.0000 mg | ORAL_TABLET | Freq: Every day | ORAL | Status: DC

## 2015-12-06 NOTE — ED Notes (Addendum)
Patient states she continues to bleed after her period should be finished, some abd cramping, she states that she has to change her pad every hour

## 2015-12-06 NOTE — ED Provider Notes (Signed)
CSN: [id number]     Arrival date & time 12/06/15  [id number] History   First MD Initiated Contact with Patient 12/06/15 [phone]     Chief Complaint  Patient presents with  . Vaginal Bleeding     (Consider location/radiation/quality/duration/timing/severity/associated sxs/prior Treatment) HPI   30 year old female with a history of migraines presents with concern for vaginal bleeding for 2 weeks, with increased today to soaking 1 pad every half hour.  Pt reports pads are soaked every 30 min, at times passing clots.  Reports lightheadedness with this. Associated mild abdominal cramping.  Past Medical History  Diagnosis Date  . Headache, migraine    Past Surgical History  Procedure Laterality Date  . Tubal ligation    . Cesarean section      x2  . Appendectomy     Family History  Problem Relation Age of Onset  . Hypertension Maternal Grandfather   . Diabetes Maternal Grandfather   . Breast cancer Mother     early 5's  . Hypertension Maternal Uncle   . Hypertension Maternal Aunt   . Anemia Maternal Aunt     low iron  . Diabetes Maternal Aunt   . Stroke Maternal Uncle    Social History  Substance Use Topics  . Smoking status: Never Smoker   . Smokeless tobacco: Never Used  . Alcohol Use: No   OB History    Gravida Para Term Preterm AB TAB SAB Ectopic Multiple Living   2 2 2       2      Review of Systems  Constitutional: Positive for fatigue. Negative for fever.  HENT: Negative for sore throat.   Eyes: Negative for visual disturbance.  Respiratory: Negative for cough and shortness of breath.   Cardiovascular: Negative for chest pain.  Gastrointestinal: Positive for abdominal pain (mild cramping).  Genitourinary: Positive for vaginal bleeding. Negative for vaginal discharge and difficulty urinating.  Musculoskeletal: Negative for back pain and neck pain.  Skin: Negative for rash.  Neurological: Positive for light-headedness. Negative for syncope and headaches.       Allergies  Review of patient's allergies indicates no known allergies.  Home Medications   Prior to Admission medications   Medication Sig Start Date End Date Taking? Authorizing Provider  norethindrone (AYGESTIN) 5 MG tablet Take 1 tablet (5 mg total) by mouth daily. 12/06/15 12/10/15  Gareth Morgan, MD   BP 110/70 mmHg  Pulse 60  Temp(Src) 98.6 F (37 C) (Oral)  Resp 18  Ht 5' (1.524 m)  Wt 200 lb (90.719 kg)  BMI 39.06 kg/m2  SpO2 100% Physical Exam  Constitutional: She is oriented to person, place, and time. She appears well-developed and well-nourished. No distress.  HENT:  Head: Normocephalic and atraumatic.  Eyes: Conjunctivae and EOM are normal.  Neck: Normal range of motion.  Cardiovascular: Normal rate, regular rhythm, normal heart sounds and intact distal pulses.  Exam reveals no gallop and no friction rub.   No murmur heard. Pulmonary/Chest: Effort normal and breath sounds normal. No respiratory distress. She has no wheezes. She has no rales.  Abdominal: Soft. She exhibits no distension. There is no tenderness. There is no guarding.  Genitourinary: Cervix exhibits no motion tenderness and no discharge. There is bleeding in the vagina. No foreign body around the vagina. No signs of injury around the vagina. No vaginal discharge found.  Vaginal blood, no active bleeding from the os  Musculoskeletal: She exhibits no edema or tenderness.  Neurological: She is alert and oriented  to person, place, and time.  Skin: Skin is warm and dry. No rash noted. She is not diaphoretic. No erythema.  Nursing note and vitals reviewed.   ED Course  Procedures (including critical care time) Labs Review Labs Reviewed  WET PREP, GENITAL - Abnormal; Notable for the following:    WBC, Wet Prep HPF POC MODERATE (*)    All other components within normal limits  BASIC METABOLIC PANEL - Abnormal; Notable for the following:    Glucose, Bld 100 (*)    Calcium 8.8 (*)    Anion gap 4  (*)    All other components within normal limits  CBC WITH DIFFERENTIAL/PLATELET - Abnormal; Notable for the following:    Hemoglobin 11.0 (*)    HCT 33.4 (*)    All other components within normal limits  PREGNANCY, URINE  GC/CHLAMYDIA PROBE AMP ([place]) NOT AT [facility]    Imaging Review No results found. I have personally reviewed and evaluated these images and lab results as part of my medical decision-making.   EKG Interpretation None      MDM   Final diagnoses:  Vaginal bleeding  Menorrhagia with irregular cycle   30 year old female with a history of migraines presents with concern for vaginal bleeding for 2 weeks, with increased today to soaking 1 pad every half hour. On exam, patient has no sign of vaginal trauma or foreign body, has vaginal bleeding, however no active hemorrhage. Pertinent to test is negative. No history of anticoagulant use or blood disorder. Her hemoglobin is 11, down from 12. Discussed that given stable hemoglobin, and my exam showing no active bleeding, feel comfortable with patient following up with her OB/GYN, however given concern that patient reports she is soaking 1 pad every half hour, provided a prescription for norethindrone for 5 days to initiate if she feels her bleeding is continuing at this pace throughout the day today.  Recommend close follow-up with OB/GYN.  Gareth Morgan, MD 12/06/15 2159

## 2015-12-06 NOTE — Discharge Instructions (Signed)

## 2015-12-07 LAB — GC/CHLAMYDIA PROBE AMP (~~LOC~~) NOT AT ARMC
Chlamydia: NEGATIVE
Neisseria Gonorrhea: NEGATIVE

## 2016-02-26 ENCOUNTER — Other Ambulatory Visit: Payer: Self-pay | Admitting: Obstetrics and Gynecology

## 2016-02-29 ENCOUNTER — Encounter (HOSPITAL_COMMUNITY): Payer: Self-pay | Admitting: *Deleted

## 2016-03-01 ENCOUNTER — Encounter (HOSPITAL_COMMUNITY): Payer: Self-pay | Admitting: *Deleted

## 2016-03-17 ENCOUNTER — Emergency Department (HOSPITAL_BASED_OUTPATIENT_CLINIC_OR_DEPARTMENT_OTHER)
Admission: EM | Admit: 2016-03-17 | Discharge: 2016-03-17 | Disposition: A | Discharge status: Home / Self Care | Payer: Managed Care, Other (non HMO) | Attending: Emergency Medicine | Admitting: Emergency Medicine

## 2016-03-17 ENCOUNTER — Encounter (HOSPITAL_BASED_OUTPATIENT_CLINIC_OR_DEPARTMENT_OTHER): Payer: Self-pay | Admitting: *Deleted

## 2016-03-17 DIAGNOSIS — R51 Headache: Secondary | ICD-10-CM | POA: Insufficient documentation

## 2016-03-17 DIAGNOSIS — Z853 Personal history of malignant neoplasm of breast: Secondary | ICD-10-CM | POA: Insufficient documentation

## 2016-03-17 DIAGNOSIS — R519 Headache, unspecified: Secondary | ICD-10-CM

## 2016-03-17 MED ORDER — ACETAMINOPHEN 325 MG PO TABS
650.0000 mg | ORAL_TABLET | Freq: Once | ORAL | Status: AC
  Administered 2016-03-17: 650 mg via ORAL
  Filled 2016-03-17: qty 2

## 2016-03-17 MED ORDER — DIPHENHYDRAMINE HCL 50 MG/ML IJ SOLN
25.0000 mg | Freq: Once | INTRAMUSCULAR | Status: AC
  Administered 2016-03-17: 25 mg via INTRAVENOUS
  Filled 2016-03-17: qty 1

## 2016-03-17 MED ORDER — SODIUM CHLORIDE 0.9 % IV BOLUS (SEPSIS)
1000.0000 mL | Freq: Once | INTRAVENOUS | Status: AC
Start: 2016-03-17 — End: 2016-03-17
  Administered 2016-03-17: 1000 mL via INTRAVENOUS

## 2016-03-17 MED ORDER — METOCLOPRAMIDE HCL 5 MG/ML IJ SOLN
10.0000 mg | Freq: Once | INTRAMUSCULAR | Status: AC
  Administered 2016-03-17: 10 mg via INTRAVENOUS
  Filled 2016-03-17: qty 2

## 2016-03-17 NOTE — ED Notes (Signed)
No c/o nausea or vomiting and no c/o diarrhea.  Pt. Follows commands with no difficulty.

## 2016-03-17 NOTE — ED Notes (Signed)
MD at bedside. 

## 2016-03-17 NOTE — ED Triage Notes (Signed)
Headache x 2 days

## 2016-03-17 NOTE — ED Notes (Signed)
Family at bedside. 

## 2016-03-17 NOTE — ED Provider Notes (Signed)
[facility] Provider Note   CSN: [id number] Arrival date & time: 03/17/16  1317     History   Chief Complaint Chief Complaint  Patient presents with  . Migraine    HPI April Valdez is a 30 y.o. female.  April Valdez is a 30 y.o. Female who presents to the ED complaining of a migraine headache for the past 2 days. Patient reports a history of migraine headaches and reports this feels similar. She complains of a frontal headache currently. She has taken nothing for treatment of her symptoms. She denies falling or head injury. Patient denies fevers, neck pain, neck stiffness, double vision, sore throat, chest pain, shortness of breath, abdominal pain, nausea, vomiting, numbness, tingling, weakness or syncope.   The history is provided by the patient. No language interpreter was used.  Migraine  Associated symptoms include headaches. Pertinent negatives include no chest pain, no abdominal pain and no shortness of breath.    Past Medical History:  Diagnosis Date  . Headache, migraine     Patient Active Problem List   Diagnosis Date Noted  . Abdominal pain 03/20/2012  . Oligomenorrhea 03/20/2012  . Endometrial polyp 03/20/2012  . FHx: breast cancer in first degree relative 03/20/2012    Past Surgical History:  Procedure Laterality Date  . APPENDECTOMY    . CESAREAN SECTION     x2  . TUBAL LIGATION      OB History    Gravida Para Term Preterm AB Living   2 2 2     2    SAB TAB Ectopic Multiple Live Births                   Home Medications    Prior to Admission medications   Not on File    Family History Family History  Problem Relation Age of Onset  . Breast cancer Mother     early 57's  . Stroke Maternal Uncle   . Hypertension Maternal Grandfather   . Diabetes Maternal Grandfather   . Hypertension Maternal Uncle   . Hypertension Maternal Aunt   . Anemia Maternal Aunt     low iron  . Diabetes Maternal Aunt     Social  History Social History  Substance Use Topics  . Smoking status: Never Smoker  . Smokeless tobacco: Never Used  . Alcohol use No     Allergies   Review of patient's allergies indicates no known allergies.   Review of Systems Review of Systems  Constitutional: Negative for chills and fever.  HENT: Negative for congestion, ear discharge, ear pain, facial swelling, nosebleeds, sore throat and trouble swallowing.   Eyes: Positive for photophobia. Negative for pain and visual disturbance.  Respiratory: Negative for cough and shortness of breath.   Cardiovascular: Negative for chest pain.  Gastrointestinal: Negative for abdominal pain, nausea and vomiting.  Genitourinary: Negative for difficulty urinating and dysuria.  Musculoskeletal: Negative for neck pain and neck stiffness.  Skin: Negative for rash.  Neurological: Positive for headaches. Negative for dizziness, syncope, weakness, light-headedness and numbness.     Physical Exam Updated Vital Signs BP 126/64   Pulse 61   Temp 98.3 F (36.8 C) (Oral)   Resp 18   Ht 5' (1.524 m)   Wt 90.7 kg   LMP 02/18/2016   SpO2 100%   BMI 39.06 kg/m   Physical Exam  Constitutional: She is oriented to person, place, and time. She appears well-developed and well-nourished. No distress.  Nontoxic appearing.  HENT:  Head: Normocephalic and atraumatic.  Right Ear: External ear normal.  Left Ear: External ear normal.  Mouth/Throat: Oropharynx is clear and moist. No oropharyngeal exudate.  Bilateral tympanic membranes are pearly-gray without erythema or loss of landmarks.  No temporal edema or tenderness.   Eyes: Conjunctivae and EOM are normal. Pupils are equal, round, and reactive to light. Right eye exhibits no discharge. Left eye exhibits no discharge.  Neck: Normal range of motion. Neck supple. No JVD present. No tracheal deviation present.  No meningeal signs.  Cardiovascular: Normal rate, regular rhythm, normal heart sounds and  intact distal pulses.  Exam reveals no gallop and no friction rub.   No murmur heard. Pulmonary/Chest: Effort normal and breath sounds normal. No stridor. No respiratory distress. She has no wheezes. She has no rales.  Abdominal: Soft. She exhibits no distension. There is no tenderness. There is no guarding.  Musculoskeletal: Normal range of motion. She exhibits no edema or tenderness.  Patient is spontaneously moving all extremities in a coordinated fashion exhibiting good strength.   Lymphadenopathy:    She has no cervical adenopathy.  Neurological: She is alert and oriented to person, place, and time. No cranial nerve deficit. Coordination normal.  She is alert and oriented 3. Cranial nerves are intact. Speech is clear and coherent. No pronator drift. Finger to nose intact. EOMs are intact. Vision is grossly intact. Normal gait.  Skin: Skin is warm and dry. Capillary refill takes less than 2 seconds. No rash noted. She is not diaphoretic. No erythema. No pallor.  Psychiatric: She has a normal mood and affect. Her behavior is normal.  Nursing note and vitals reviewed.    ED Treatments / Results  Labs (all labs ordered are listed, but only abnormal results are displayed) Labs Reviewed - No data to display  EKG  EKG Interpretation None       Radiology No results found.  Procedures Procedures (including critical care time)  Medications Ordered in ED Medications  sodium chloride 0.9 % bolus 1,000 mL (1,000 mLs Intravenous New Bag/Given 03/17/16 1406)  metoCLOPramide (REGLAN) injection 10 mg (10 mg Intravenous Given 03/17/16 1418)  diphenhydrAMINE (BENADRYL) injection 25 mg (25 mg Intravenous Given 03/17/16 1417)  acetaminophen (TYLENOL) tablet 650 mg (650 mg Oral Given 03/17/16 1418)     Initial Impression / Assessment and Plan / ED Course  I have reviewed the triage vital signs and the nursing notes.  Pertinent labs & imaging results that were available during my care of  the patient were reviewed by me and considered in my medical decision making (see chart for details).  Clinical Course   This is a 30 y.o. Female who presents to the ED complaining of a migraine headache for the past 2 days. Patient reports a history of migraine headaches and reports this feels similar. She complains of a frontal headache currently. She has taken nothing for treatment of her symptoms. Pt HA treated and resolved while in ED. Patient is afebrile and non-toxic appearing.  Presentation is like pts typical HA and non concerning for [facility], ICH, Meningitis, or temporal arteritis. Pt is afebrile with no focal neuro deficits, nuchal rigidity, or change in vision. Pt is to follow up with PCP to discuss prophylactic medication. Pt verbalizes understanding and is agreeable with plan to discharge. I advised the patient to follow-up with their primary care provider this week. I advised the patient to return to the emergency department with new or worsening symptoms or  new concerns. The patient verbalized understanding and agreement with plan.    Final Clinical Impressions(s) / ED Diagnoses   Final diagnoses:  Bad headache    New Prescriptions New Prescriptions   No medications on file     Waynetta Pean, PA-C 03/17/16 1448    Leonard Schwartz, MD 03/20/16 [phone]

## 2016-03-29 ENCOUNTER — Ambulatory Visit (HOSPITAL_COMMUNITY): Admit: 2016-03-29 | Payer: Managed Care, Other (non HMO) | Admitting: Obstetrics and Gynecology

## 2016-03-29 SURGERY — DILATATION & CURETTAGE/HYSTEROSCOPY WITH RESECTOCOPE
Anesthesia: Choice

## 2019-06-26 ENCOUNTER — Other Ambulatory Visit: Payer: Self-pay | Admitting: Obstetrics and Gynecology

## 2019-06-26 DIAGNOSIS — R928 Other abnormal and inconclusive findings on diagnostic imaging of breast: Secondary | ICD-10-CM

## 2019-07-04 ENCOUNTER — Other Ambulatory Visit: Payer: Self-pay

## 2019-07-04 ENCOUNTER — Ambulatory Visit
Admission: RE | Admit: 2019-07-04 | Discharge: 2019-07-04 | Disposition: A | Discharge status: Home / Self Care | Payer: 59 | Source: Ambulatory Visit | Attending: Obstetrics and Gynecology | Admitting: Obstetrics and Gynecology

## 2019-07-04 ENCOUNTER — Other Ambulatory Visit: Payer: Self-pay | Admitting: Obstetrics and Gynecology

## 2019-07-04 ENCOUNTER — Ambulatory Visit
Admission: RE | Admit: 2019-07-04 | Discharge: 2019-07-04 | Disposition: A | Discharge status: Home / Self Care | Payer: Managed Care, Other (non HMO) | Source: Ambulatory Visit | Attending: Obstetrics and Gynecology | Admitting: Obstetrics and Gynecology

## 2019-07-04 DIAGNOSIS — R928 Other abnormal and inconclusive findings on diagnostic imaging of breast: Secondary | ICD-10-CM

## 2019-07-12 ENCOUNTER — Ambulatory Visit
Admission: RE | Admit: 2019-07-12 | Discharge: 2019-07-12 | Disposition: A | Discharge status: Home / Self Care | Payer: 59 | Source: Ambulatory Visit | Attending: Obstetrics and Gynecology | Admitting: Obstetrics and Gynecology

## 2019-07-12 ENCOUNTER — Other Ambulatory Visit: Payer: Self-pay

## 2019-07-12 DIAGNOSIS — R928 Other abnormal and inconclusive findings on diagnostic imaging of breast: Secondary | ICD-10-CM

## 2019-08-01 ENCOUNTER — Telehealth: Payer: Self-pay | Admitting: Licensed Clinical Social Worker

## 2019-08-01 NOTE — Telephone Encounter (Signed)
Received a genetic counseling referral from Dr. Donne Hazel at [facility] for fhx of breast cancer. Pt has been cld and scheduled to see Brianna on 3/15 at 1pm. I verified the pt's email address. She doesn't have an active mychart acct at this time. An invite has been sent to the pt.

## 2019-08-12 ENCOUNTER — Inpatient Hospital Stay
Payer: No Typology Code available for payment source | Attending: Licensed Clinical Social Worker | Admitting: Licensed Clinical Social Worker

## 2019-08-12 ENCOUNTER — Encounter: Payer: Self-pay | Admitting: Licensed Clinical Social Worker

## 2019-08-12 DIAGNOSIS — Z800 Family history of malignant neoplasm of digestive organs: Secondary | ICD-10-CM | POA: Diagnosis not present

## 2019-08-12 DIAGNOSIS — Z803 Family history of malignant neoplasm of breast: Secondary | ICD-10-CM | POA: Diagnosis not present

## 2019-08-12 DIAGNOSIS — Z8042 Family history of malignant neoplasm of prostate: Secondary | ICD-10-CM | POA: Diagnosis not present

## 2019-08-12 DIAGNOSIS — Z801 Family history of malignant neoplasm of trachea, bronchus and lung: Secondary | ICD-10-CM | POA: Insufficient documentation

## 2019-08-12 DIAGNOSIS — Z1379 Encounter for other screening for genetic and chromosomal anomalies: Secondary | ICD-10-CM | POA: Diagnosis not present

## 2019-08-12 NOTE — Progress Notes (Signed)
REFERRING PROVIDER: Rolm Bookbinder, MD [place],   [id number]  PRIMARY PROVIDER:  Patient, No Pcp Per  PRIMARY REASON FOR VISIT:  1. Family history of breast cancer   2. Family history of stomach cancer   3. Family history of prostate cancer   4. Family history of lung cancer     I connected with April Valdez on 1984/07/15 at 12:55 PM EDT by MyChart video and verified that I am speaking with the correct person using two identifiers.    Patient location: home Provider location: [place]:   April Valdez, a 34 y.o. female, was seen for a [facility] cancer genetics consultation at the request of Dr. Donne Hazel due to a family history of cancer.  April Valdez presents to clinic today to discuss the possibility of a hereditary predisposition to cancer, genetic testing, and to further clarify her future cancer risks, as well as potential cancer risks for family members.   April Valdez is a 34 y.o. female with no personal history of cancer.    CANCER HISTORY:  Oncology History   No history exists.     RISK FACTORS:  Menarche was at age 45.  First live birth at age 20.  OCP use for approximately 1 years.  Ovaries intact: yes.  Hysterectomy: no.  Menopausal status: premenopausal.  HRT use: 0 years. Colonoscopy: no; not examined. Mammogram within the last year: yes. Number of breast biopsies: 2. Up to date with pelvic exams: yes. Any excessive radiation exposure in the past: no Per Dr. Cristal Generous note, Tyrer Cuzick >20%.   Past Medical History:  Diagnosis Date  . Family history of breast cancer   . Family history of lung cancer   . Family history of prostate cancer   . Family history of stomach cancer   . Headache, migraine     Past Surgical History:  Procedure Laterality Date  . APPENDECTOMY    . CESAREAN SECTION     x2  . TUBAL LIGATION      Social History   Socioeconomic History  . Marital status: Single     Spouse name: Not on file  . Number of children: Not on file  . Years of education: Not on file  . Highest education level: Not on file  Occupational History  . Not on file  Tobacco Use  . Smoking status: Never Smoker  . Smokeless tobacco: Never Used  Substance and Sexual Activity  . Alcohol use: No  . Drug use: No  . Sexual activity: Yes    Birth control/protection: Surgical    Comment: BTL  Other Topics Concern  . Not on file  Social History Narrative  . Not on file   Social Determinants of Health   Financial Resource Strain:   . Difficulty of Paying Living Expenses:   Food Insecurity:   . Worried About Charity fundraiser in the Last Year:   . Arboriculturist in the Last Year:   Transportation Needs:   . Film/video editor (Medical):   Marland Kitchen Lack of Transportation (Non-Medical):   Physical Activity:   . Days of Exercise per Week:   . Minutes of Exercise per Session:   Stress:   . Feeling of Stress :   Social Connections:   . Frequency of Communication with Friends and Family:   . Frequency of Social Gatherings with Friends and Family:   . Attends Religious Services:   .  Active Member of Clubs or Organizations:   . Attends Archivist Meetings:   Marland Kitchen Marital Status:      FAMILY HISTORY:  We obtained a detailed, 4-generation family history.  Significant diagnoses are listed below: Family History  Problem Relation Age of Onset  . Breast cancer Mother 11  . Stroke Maternal Uncle   . Hypertension Maternal Grandfather   . Diabetes Maternal Grandfather   . Prostate cancer Maternal Grandfather   . Hypertension Maternal Uncle   . Stomach cancer Maternal Uncle 61  . Hypertension Maternal Aunt   . Anemia Maternal Aunt        low iron  . Diabetes Maternal Aunt   . Stomach cancer Maternal Aunt 70  . Lung cancer Maternal Uncle    April Valdez has 2 sons, April Valdez age 81 and April Valdez age 62, no cancers. She hS 1 maternal half sister, April Valdez, 78, no history of  cancer.   April Valdez does not have information about her paternal side of the family. Her father is living and there is no cancer on this side that she is aware of.   April Valdez mother was diagnosed with breast cancer at 2 and died at 8. Patient had 4 maternal uncles and 1 maternal aunt. Her aunt was diagnosed with stomach cancer at 65 and died at 38. An uncle was diagnosed with stomach cancer at 38 and died at 102. Another uncle was diagnosed with lung cancer at 58 and had history of smoking. A maternal cousin had throat cancer and history of smoking. Maternal grandfather is living at 41 and had history of prostate cancer. Maternal grandmother died of a heart attack at 13.   April Valdez is unaware of previous family history of genetic testing for hereditary cancer risks. Patient's maternal ancestors are of African American descent, and paternal ancestors are of African American descent. There is no reported Ashkenazi Jewish ancestry. There is no known consanguinity.  GENETIC COUNSELING ASSESSMENT: April Valdez is a 34 y.o. female with a family history of breast cancer which is somewhat suggestive of a hereditary cancer syndrome and predisposition to cancer. We, therefore, discussed and recommended the following at today's visit.   DISCUSSION: We discussed that 5 - 10% of breast cancer is hereditary, with most cases associated with BRCA1/BRCA2 mutations.  There are other genes that can be associated with hereditary breast cancer syndromes. There is one gene, CDH1, that is associated with both breast and gastric cancer, and other genes associated with stomach cancer as well. We discussed that testing is beneficial for several reasons including knowing how to follow individuals for cancer screenings and understand if other family members could be at risk for cancer and allow them to undergo genetic testing.   We reviewed the characteristics, features and inheritance patterns of hereditary cancer syndromes.  We also discussed genetic testing, including the appropriate family members to test, the process of testing, insurance coverage and turn-around-time for results. We discussed the implications of a negative, positive and/or variant of uncertain significant result. We recommended April Valdez pursue genetic testing for the [facility] Common Hereditary Cancers gene panel.   The Common Hereditary Cancers Panel offered by [facility] includes sequencing and/or deletion duplication testing of the following 48 genes: APC, ATM, AXIN2, BARD1, BMPR1A, BRCA1, BRCA2, BRIP1, CDH1, CDKN2A (p14ARF), CDKN2A (p16INK4a), CKD4, CHEK2, CTNNA1, DICER1, EPCAM (Deletion/duplication testing only), GREM1 (promoter region deletion/duplication testing only), KIT, MEN1, MLH1, MSH2, MSH3, MSH6, MUTYH, NBN, NF1, NHTL1, PALB2, PDGFRA, PMS2, POLD1, POLE, PTEN,  RAD50, RAD51C, RAD51D, RNF43, SDHB, SDHC, SDHD, SMAD4, SMARCA4. STK11, TP53, TSC1, TSC2, and VHL.  The following genes were evaluated for sequence changes only: SDHA and HOXB13 c.251G>A variant only.  Based on April Valdez's family history of breast cancer, she meets medical criteria for genetic testing. Despite that she meets criteria, she may still have an out of pocket cost.   We discussed that some people do not want to undergo genetic testing due to fear of genetic discrimination.  A federal law called the Genetic Information Non-Discrimination Act (GINA) of 2008 helps protect individuals against genetic discrimination based on their genetic test results.  It impacts both health insurance and employment.  For health insurance, it protects against increased premiums, being kicked off insurance or being forced to take a test in order to be insured.  For employment it protects against hiring, firing and promoting decisions based on genetic test results.  Health status due to a cancer diagnosis is not protected under GINA.  This law does not protect life insurance, disability insurance, or other  types of insurance.   PLAN: After considering the risks, benefits, and limitations, April Valdez provided informed consent to pursue genetic testing. A saliva kit was mailed to her and the sample will be sent to [facility] for analysis of the Common Hereditary Cancers Panel. Results should be available within approximately 2-3 weeks' time, at which point they will be disclosed by telephone to April Valdez, as will any additional recommendations warranted by these results. April Valdez will receive a summary of her genetic counseling visit and a copy of her results once available. This information will also be available in Epic.   Based on Ms. Rickey's family history, we recommended her half sister/maternal relatives have genetic counseling and testing. April Valdez will let us know if we can be of any assistance in coordinating genetic counseling and/or testing for this family member.    April Valdez questions were answered to her satisfaction today. Our contact information was provided should additional questions or concerns arise. Thank you for the referral and allowing [place] to share in the care of your patient.   Faith Rogue, MS, [facility] Genetic Counselor [email or web address] Phone: [phone]  The patient was seen for a total of 35 minutes in face-to-face genetic counseling.  Drs. Magrinat, Lindi Adie and/or Burr Medico were available for discussion regarding this case.   _______________________________________________________________________ For Office Staff:  Number of people involved in session: 1 Was an Intern/ student involved with case: no

## 2019-08-15 ENCOUNTER — Other Ambulatory Visit: Payer: Self-pay | Admitting: General Surgery

## 2019-08-15 DIAGNOSIS — Z803 Family history of malignant neoplasm of breast: Secondary | ICD-10-CM

## 2019-08-16 ENCOUNTER — Other Ambulatory Visit: Payer: Self-pay | Admitting: General Surgery

## 2019-08-16 DIAGNOSIS — Z803 Family history of malignant neoplasm of breast: Secondary | ICD-10-CM

## 2019-09-04 ENCOUNTER — Other Ambulatory Visit: Payer: Self-pay | Admitting: Licensed Clinical Social Worker

## 2019-09-04 ENCOUNTER — Telehealth: Payer: Self-pay | Admitting: Licensed Clinical Social Worker

## 2019-09-04 DIAGNOSIS — Z803 Family history of malignant neoplasm of breast: Secondary | ICD-10-CM

## 2019-09-04 DIAGNOSIS — Z8042 Family history of malignant neoplasm of prostate: Secondary | ICD-10-CM

## 2019-09-04 DIAGNOSIS — Z800 Family history of malignant neoplasm of digestive organs: Secondary | ICD-10-CM

## 2019-09-04 NOTE — Telephone Encounter (Signed)
Left a VM for patient that the sample for her genetic testing has failed in the lab and a new sample will need to be submitted, or the test can be cancelled. Left my call back number.

## 2019-09-10 ENCOUNTER — Encounter (HOSPITAL_BASED_OUTPATIENT_CLINIC_OR_DEPARTMENT_OTHER): Payer: Self-pay | Admitting: General Surgery

## 2019-09-10 ENCOUNTER — Other Ambulatory Visit: Payer: Self-pay

## 2019-09-16 ENCOUNTER — Other Ambulatory Visit (HOSPITAL_COMMUNITY)
Admission: RE | Admit: 2019-09-16 | Discharge: 2019-09-16 | Disposition: A | Discharge status: Home / Self Care | Payer: 59 | Source: Ambulatory Visit | Attending: General Surgery | Admitting: General Surgery

## 2019-09-16 DIAGNOSIS — Z20822 Contact with and (suspected) exposure to covid-19: Secondary | ICD-10-CM | POA: Insufficient documentation

## 2019-09-16 DIAGNOSIS — Z01812 Encounter for preprocedural laboratory examination: Secondary | ICD-10-CM | POA: Insufficient documentation

## 2019-09-16 LAB — SARS CORONAVIRUS 2 (TAT 6-24 HRS): SARS Coronavirus 2: NEGATIVE

## 2019-09-18 ENCOUNTER — Inpatient Hospital Stay: Payer: No Typology Code available for payment source | Attending: Licensed Clinical Social Worker

## 2019-09-18 ENCOUNTER — Ambulatory Visit
Admission: RE | Admit: 2019-09-18 | Discharge: 2019-09-18 | Disposition: A | Discharge status: Home / Self Care | Payer: 59 | Source: Ambulatory Visit | Attending: General Surgery | Admitting: General Surgery

## 2019-09-18 ENCOUNTER — Other Ambulatory Visit: Payer: Self-pay

## 2019-09-18 DIAGNOSIS — Z8042 Family history of malignant neoplasm of prostate: Secondary | ICD-10-CM

## 2019-09-18 DIAGNOSIS — Z803 Family history of malignant neoplasm of breast: Secondary | ICD-10-CM

## 2019-09-18 DIAGNOSIS — Z800 Family history of malignant neoplasm of digestive organs: Secondary | ICD-10-CM

## 2019-09-18 LAB — GENETIC SCREENING ORDER

## 2019-09-18 MED ORDER — ENSURE PRE-SURGERY PO LIQD: 296.0000 mL | Freq: Once | ORAL | Status: DC

## 2019-09-18 NOTE — Progress Notes (Signed)

## 2019-09-19 ENCOUNTER — Ambulatory Visit (HOSPITAL_BASED_OUTPATIENT_CLINIC_OR_DEPARTMENT_OTHER): Payer: No Typology Code available for payment source | Admitting: Anesthesiology

## 2019-09-19 ENCOUNTER — Encounter (HOSPITAL_BASED_OUTPATIENT_CLINIC_OR_DEPARTMENT_OTHER): Payer: Self-pay | Admitting: General Surgery

## 2019-09-19 ENCOUNTER — Encounter (HOSPITAL_BASED_OUTPATIENT_CLINIC_OR_DEPARTMENT_OTHER)
Admission: RE | Disposition: A | Discharge status: Home / Self Care | Payer: Self-pay | Source: Home / Self Care | Attending: General Surgery

## 2019-09-19 ENCOUNTER — Ambulatory Visit
Admission: RE | Admit: 2019-09-19 | Discharge: 2019-09-19 | Disposition: A | Discharge status: Home / Self Care | Payer: 59 | Source: Ambulatory Visit | Attending: General Surgery | Admitting: General Surgery

## 2019-09-19 ENCOUNTER — Ambulatory Visit (HOSPITAL_BASED_OUTPATIENT_CLINIC_OR_DEPARTMENT_OTHER)
Admission: RE | Admit: 2019-09-19 | Discharge: 2019-09-19 | Disposition: A | Discharge status: Home / Self Care | Payer: No Typology Code available for payment source | Attending: General Surgery | Admitting: General Surgery

## 2019-09-19 DIAGNOSIS — Z6841 Body Mass Index (BMI) 40.0 and over, adult: Secondary | ICD-10-CM | POA: Diagnosis not present

## 2019-09-19 DIAGNOSIS — Z85028 Personal history of other malignant neoplasm of stomach: Secondary | ICD-10-CM | POA: Insufficient documentation

## 2019-09-19 DIAGNOSIS — Z803 Family history of malignant neoplasm of breast: Secondary | ICD-10-CM

## 2019-09-19 DIAGNOSIS — Z801 Family history of malignant neoplasm of trachea, bronchus and lung: Secondary | ICD-10-CM | POA: Insufficient documentation

## 2019-09-19 DIAGNOSIS — D242 Benign neoplasm of left breast: Secondary | ICD-10-CM | POA: Diagnosis not present

## 2019-09-19 HISTORY — DX: Personal history of other diseases of the female genital tract: Z87.42

## 2019-09-19 HISTORY — PX: RADIOACTIVE SEED GUIDED EXCISIONAL BREAST BIOPSY: SHX6490

## 2019-09-19 LAB — POCT PREGNANCY, URINE: Preg Test, Ur: NEGATIVE

## 2019-09-19 SURGERY — RADIOACTIVE SEED GUIDED BREAST BIOPSY
Anesthesia: General | Site: Breast | Laterality: Left

## 2019-09-19 MED ORDER — CEFAZOLIN SODIUM-DEXTROSE 2-4 GM/100ML-% IV SOLN
INTRAVENOUS | Status: AC
  Filled 2019-09-19: qty 100

## 2019-09-19 MED ORDER — BUPIVACAINE HCL (PF) 0.25 % IJ SOLN
INTRAMUSCULAR | Status: DC | PRN
  Administered 2019-09-19: 10 mL

## 2019-09-19 MED ORDER — MIDAZOLAM HCL 5 MG/5ML IJ SOLN
INTRAMUSCULAR | Status: DC | PRN
  Administered 2019-09-19: 2 mg via INTRAVENOUS

## 2019-09-19 MED ORDER — MIDAZOLAM HCL 2 MG/2ML IJ SOLN
INTRAMUSCULAR | Status: AC
  Filled 2019-09-19: qty 2

## 2019-09-19 MED ORDER — PROPOFOL 10 MG/ML IV BOLUS
INTRAVENOUS | Status: DC | PRN
  Administered 2019-09-19: 200 mg via INTRAVENOUS

## 2019-09-19 MED ORDER — LACTATED RINGERS IV SOLN: INTRAVENOUS | Status: DC

## 2019-09-19 MED ORDER — GABAPENTIN 100 MG PO CAPS
ORAL_CAPSULE | ORAL | Status: AC
  Filled 2019-09-19: qty 1

## 2019-09-19 MED ORDER — ACETAMINOPHEN 500 MG PO TABS
ORAL_TABLET | ORAL | Status: AC
  Filled 2019-09-19: qty 2

## 2019-09-19 MED ORDER — OXYCODONE HCL 5 MG PO TABS
5.0000 mg | ORAL_TABLET | Freq: Once | ORAL | Status: AC
  Administered 2019-09-19: 5 mg via ORAL

## 2019-09-19 MED ORDER — KETOROLAC TROMETHAMINE 30 MG/ML IJ SOLN: 30.0000 mg | Freq: Once | INTRAMUSCULAR | Status: DC | PRN

## 2019-09-19 MED ORDER — LIDOCAINE HCL (CARDIAC) PF 100 MG/5ML IV SOSY
PREFILLED_SYRINGE | INTRAVENOUS | Status: DC | PRN
  Administered 2019-09-19: 60 mg via INTRAVENOUS

## 2019-09-19 MED ORDER — ONDANSETRON HCL 4 MG/2ML IJ SOLN
INTRAMUSCULAR | Status: AC
  Filled 2019-09-19: qty 2

## 2019-09-19 MED ORDER — KETOROLAC TROMETHAMINE 15 MG/ML IJ SOLN
INTRAMUSCULAR | Status: AC
  Filled 2019-09-19: qty 1

## 2019-09-19 MED ORDER — CEFAZOLIN SODIUM-DEXTROSE 2-4 GM/100ML-% IV SOLN
2.0000 g | INTRAVENOUS | Status: AC
  Administered 2019-09-19: 2 g via INTRAVENOUS

## 2019-09-19 MED ORDER — DEXAMETHASONE SODIUM PHOSPHATE 10 MG/ML IJ SOLN
INTRAMUSCULAR | Status: DC | PRN
  Administered 2019-09-19: 10 mg via INTRAVENOUS

## 2019-09-19 MED ORDER — PHENYLEPHRINE 40 MCG/ML (10ML) SYRINGE FOR IV PUSH (FOR BLOOD PRESSURE SUPPORT)
PREFILLED_SYRINGE | INTRAVENOUS | Status: AC
  Filled 2019-09-19: qty 10

## 2019-09-19 MED ORDER — DEXAMETHASONE SODIUM PHOSPHATE 10 MG/ML IJ SOLN
INTRAMUSCULAR | Status: AC
  Filled 2019-09-19: qty 1

## 2019-09-19 MED ORDER — OXYCODONE HCL 5 MG PO TABS
ORAL_TABLET | ORAL | Status: AC
  Filled 2019-09-19: qty 1

## 2019-09-19 MED ORDER — PROPOFOL 10 MG/ML IV BOLUS
INTRAVENOUS | Status: AC
  Filled 2019-09-19: qty 20

## 2019-09-19 MED ORDER — EPHEDRINE SULFATE 50 MG/ML IJ SOLN
INTRAMUSCULAR | Status: DC | PRN
  Administered 2019-09-19: 10 mg via INTRAVENOUS
  Administered 2019-09-19: 25 mg via INTRAVENOUS

## 2019-09-19 MED ORDER — GABAPENTIN 100 MG PO CAPS
100.0000 mg | ORAL_CAPSULE | ORAL | Status: AC
  Administered 2019-09-19: 100 mg via ORAL

## 2019-09-19 MED ORDER — FENTANYL CITRATE (PF) 100 MCG/2ML IJ SOLN: 25.0000 ug | INTRAMUSCULAR | Status: DC | PRN

## 2019-09-19 MED ORDER — LACTATED RINGERS IV SOLN: INTRAVENOUS | Status: DC | PRN

## 2019-09-19 MED ORDER — KETOROLAC TROMETHAMINE 15 MG/ML IJ SOLN
15.0000 mg | INTRAMUSCULAR | Status: AC
  Administered 2019-09-19: 15 mg via INTRAVENOUS

## 2019-09-19 MED ORDER — LIDOCAINE 2% (20 MG/ML) 5 ML SYRINGE
INTRAMUSCULAR | Status: AC
  Filled 2019-09-19: qty 5

## 2019-09-19 MED ORDER — ACETAMINOPHEN 500 MG PO TABS
1000.0000 mg | ORAL_TABLET | ORAL | Status: AC
  Administered 2019-09-19: 1000 mg via ORAL

## 2019-09-19 MED ORDER — FENTANYL CITRATE (PF) 100 MCG/2ML IJ SOLN
INTRAMUSCULAR | Status: DC | PRN
  Administered 2019-09-19: 50 ug via INTRAVENOUS
  Administered 2019-09-19: 25 ug via INTRAVENOUS

## 2019-09-19 MED ORDER — EPHEDRINE 5 MG/ML INJ
INTRAVENOUS | Status: AC
  Filled 2019-09-19: qty 10

## 2019-09-19 MED ORDER — GLYCOPYRROLATE 0.2 MG/ML IJ SOLN
INTRAMUSCULAR | Status: DC | PRN
  Administered 2019-09-19: .1 mg via INTRAVENOUS

## 2019-09-19 MED ORDER — FENTANYL CITRATE (PF) 100 MCG/2ML IJ SOLN
INTRAMUSCULAR | Status: AC
  Filled 2019-09-19: qty 2

## 2019-09-19 MED ORDER — PROMETHAZINE HCL 25 MG/ML IJ SOLN: 6.2500 mg | INTRAMUSCULAR | Status: DC | PRN

## 2019-09-19 SURGICAL SUPPLY — 56 items
APPLIER CLIP 9.375 MED OPEN (MISCELLANEOUS)
BINDER BREAST 3XL (GAUZE/BANDAGES/DRESSINGS) ×3 IMPLANT
BINDER BREAST LRG (GAUZE/BANDAGES/DRESSINGS) IMPLANT
BINDER BREAST MEDIUM (GAUZE/BANDAGES/DRESSINGS) IMPLANT
BINDER BREAST XLRG (GAUZE/BANDAGES/DRESSINGS) IMPLANT
BINDER BREAST XXLRG (GAUZE/BANDAGES/DRESSINGS) IMPLANT
BLADE SURG 15 STRL LF DISP TIS (BLADE) ×1 IMPLANT
BLADE SURG 15 STRL SS (BLADE) ×3
CANISTER SUC SOCK COL 7IN (MISCELLANEOUS) IMPLANT
CANISTER SUCT 1200ML W/VALVE (MISCELLANEOUS) IMPLANT
CHLORAPREP W/TINT 26 (MISCELLANEOUS) ×6 IMPLANT
CLIP APPLIE 9.375 MED OPEN (MISCELLANEOUS) IMPLANT
CLIP VESOCCLUDE SM WIDE 6/CT (CLIP) IMPLANT
CLOSURE WOUND 1/2 X4 (GAUZE/BANDAGES/DRESSINGS) ×1
COVER BACK TABLE 60X90IN (DRAPES) ×3 IMPLANT
COVER MAYO STAND STRL (DRAPES) ×3 IMPLANT
COVER PROBE W GEL 5X96 (DRAPES) ×3 IMPLANT
COVER WAND RF STERILE (DRAPES) IMPLANT
DECANTER SPIKE VIAL GLASS SM (MISCELLANEOUS) IMPLANT
DERMABOND ADVANCED (GAUZE/BANDAGES/DRESSINGS) ×2
DERMABOND ADVANCED .7 DNX12 (GAUZE/BANDAGES/DRESSINGS) ×1 IMPLANT
DRAPE LAPAROSCOPIC ABDOMINAL (DRAPES) ×3 IMPLANT
DRAPE UTILITY XL STRL (DRAPES) ×3 IMPLANT
DRSG TEGADERM 4X4.75 (GAUZE/BANDAGES/DRESSINGS) IMPLANT
ELECT COATED BLADE 2.86 ST (ELECTRODE) ×3 IMPLANT
ELECT REM PT RETURN 9FT ADLT (ELECTROSURGICAL) ×3
ELECTRODE REM PT RTRN 9FT ADLT (ELECTROSURGICAL) ×1 IMPLANT
GAUZE SPONGE 4X4 12PLY STRL LF (GAUZE/BANDAGES/DRESSINGS) IMPLANT
GLOVE BIO SURGEON STRL SZ7 (GLOVE) ×6 IMPLANT
GLOVE BIOGEL PI IND STRL 7.5 (GLOVE) ×1 IMPLANT
GLOVE BIOGEL PI INDICATOR 7.5 (GLOVE) ×2
GOWN STRL REUS W/ TWL LRG LVL3 (GOWN DISPOSABLE) ×2 IMPLANT
GOWN STRL REUS W/TWL LRG LVL3 (GOWN DISPOSABLE) ×6
HEMOSTAT ARISTA ABSORB 3G PWDR (HEMOSTASIS) IMPLANT
KIT MARKER MARGIN INK (KITS) ×3 IMPLANT
NEEDLE HYPO 25X1 1.5 SAFETY (NEEDLE) ×3 IMPLANT
NS IRRIG 1000ML POUR BTL (IV SOLUTION) IMPLANT
PENCIL SMOKE EVACUATOR (MISCELLANEOUS) ×3 IMPLANT
RETRACTOR ONETRAX LX 90X20 (MISCELLANEOUS) ×3 IMPLANT
SET BASIN DAY SURGERY F.S. (CUSTOM PROCEDURE TRAY) ×3 IMPLANT
SLEEVE SCD COMPRESS KNEE MED (MISCELLANEOUS) ×3 IMPLANT
SPONGE LAP 4X18 RFD (DISPOSABLE) ×3 IMPLANT
STRIP CLOSURE SKIN 1/2X4 (GAUZE/BANDAGES/DRESSINGS) ×2 IMPLANT
SUT MNCRL AB 4-0 PS2 18 (SUTURE) IMPLANT
SUT MON AB 5-0 PS2 18 (SUTURE) ×3 IMPLANT
SUT SILK 2 0 SH (SUTURE) IMPLANT
SUT VIC AB 2-0 SH 27 (SUTURE) ×3
SUT VIC AB 2-0 SH 27XBRD (SUTURE) ×1 IMPLANT
SUT VIC AB 3-0 SH 27 (SUTURE) ×3
SUT VIC AB 3-0 SH 27X BRD (SUTURE) ×1 IMPLANT
SYR CONTROL 10ML LL (SYRINGE) ×3 IMPLANT
TOWEL GREEN STERILE FF (TOWEL DISPOSABLE) ×3 IMPLANT
TRAY FAXITRON CT DISP (TRAY / TRAY PROCEDURE) ×3 IMPLANT
TUBE CONNECTING 20'X1/4 (TUBING)
TUBE CONNECTING 20X1/4 (TUBING) IMPLANT
YANKAUER SUCT BULB TIP NO VENT (SUCTIONS) IMPLANT

## 2019-09-19 NOTE — Transfer of Care (Signed)
Immediate Anesthesia Transfer of Care Note  Patient: April Valdez  Procedure(s) Performed: LEFT BREAST RADIOACTIVE SEED GUIDED EXCISIONAL BREAST BIOPSY (Left Breast)  Patient Location: PACU  Anesthesia Type:General  Level of Consciousness: sedated  Airway & Oxygen Therapy: Patient Spontanous Breathing and Patient connected to face mask oxygen  Post-op Assessment: Report given to RN and Post -op Vital signs reviewed and stable  Post vital signs: Reviewed and stable  Last Vitals:  Vitals Value Taken Time  BP    Temp    Pulse 114 09/19/19 1157  Resp 14 09/19/19 1157  SpO2 100 % 09/19/19 1157  Vitals shown include unvalidated device data.  Last Pain:  Vitals:   09/19/19 1013  TempSrc: Temporal  PainSc: 0-No pain         Complications: No apparent anesthesia complications

## 2019-09-19 NOTE — Interval H&P Note (Signed)
History and Physical Interval Note:  09/19/2019 10:47 AM  April Valdez  has presented today for surgery, with the diagnosis of LEFT BREAST PAPILLOMA.  The various methods of treatment have been discussed with the patient and family. After consideration of risks, benefits and other options for treatment, the patient has consented to  Procedure(s) with comments: LEFT BREAST RADIOACTIVE SEED GUIDED EXCISIONAL BREAST BIOPSY (Left) - LMA as a surgical intervention.  The patient's history has been reviewed, patient examined, no change in status, stable for surgery.  I have reviewed the patient's chart and labs.  Questions were answered to the patient's satisfaction.     Rolm Bookbinder

## 2019-09-19 NOTE — Op Note (Signed)
Preoperative diagnosis:Left breast ductal papilloma Postoperative diagnosis: Same as above Procedure: Left breast seed guided excisional biopsy Surgeon: Dr Serita Grammes Anesthesia: General estimated blood loss:minimal Specimens:leftbreast tissue marked with paint containing seed and clip Drains none Complications none Sponge needle count was correct at completion Disposition to recovery in stable condition  Indications:33 yof referred for papilloma left breast. she has family history of bca in her mom who is deceased at age 25. she has all on maternal side history of lung ca in uncle, gastric cancer in an aunt and uncle and gf with prostate cancer. she has prior right breast biopsy that is benign and has been getting mm due to moms history. she has no mass or dc. she had screening mm that shows a density breasts. she has inferior central left breast masses on mm that on [place] there are two adjacent masses measuring 2 mm and 5 mm. nodes are negative. 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 We discussed options and elected to proceed with excision  Procedure: After informed consent was obtained the patient was first given antibiotics. SCDs were placed. She was placed under general anesthesia without complication. She was prepped and draped in the standard sterile surgical fashion. Surgical timeout was then performed.  I had the mammograms available for my review in the operating room. I confirmed the seed was present prior to beginning. Imade a periareolar incision to hide the scar later.I infiltrated this with Marcaine. I then made an incision. I then removedthe seed and some of the surrounding tissue. I then marked this with paint. I passed this off the table and mammogram confirmed removal of the seed and the clip. I then obtained hemostasis. I closed the breast tissue with 2-0 Vicryl suture. I then closed the skin with 3-0 Vicryl and5-0 Monocryl.  Glue and Steri-Strips were applied. She tolerated this well was extubated and transferred to recovery stable.

## 2019-09-19 NOTE — Anesthesia Postprocedure Evaluation (Signed)
Anesthesia Post Note  Patient: Dance movement psychotherapist  Procedure(s) Performed: LEFT BREAST RADIOACTIVE SEED GUIDED EXCISIONAL BREAST BIOPSY (Left Breast)     Patient location during evaluation: PACU Anesthesia Type: General Level of consciousness: awake and alert Pain management: pain level controlled Vital Signs Assessment: post-procedure vital signs reviewed and stable Respiratory status: spontaneous breathing, nonlabored ventilation, respiratory function stable and patient connected to nasal cannula oxygen Cardiovascular status: blood pressure returned to baseline and stable Postop Assessment: no apparent nausea or vomiting Anesthetic complications: no    Last Vitals:  Vitals:   09/19/19 1200 09/19/19 1215  BP: (!) 119/55 (!) 109/55  Pulse: (!) 106 91  Resp: 17 (!) 21  Temp: 36.6 C   SpO2: 100% 100%    Last Pain:  Vitals:   09/19/19 1215  TempSrc:   PainSc: 0-No pain                 Tanyia Grabbe S

## 2019-09-19 NOTE — Anesthesia Procedure Notes (Signed)
Procedure Name: LMA Insertion Date/Time: 09/19/2019 11:16 AM Performed by: Marrianne Mood, CRNA Pre-anesthesia Checklist: Patient identified, Emergency Drugs available, Suction available, Patient being monitored and Timeout performed Patient Re-evaluated:Patient Re-evaluated prior to induction Oxygen Delivery Method: Circle system utilized Preoxygenation: Pre-oxygenation with 100% oxygen Induction Type: IV induction Ventilation: Mask ventilation without difficulty LMA: LMA inserted LMA Size: 4.0 Number of attempts: 1 Airway Equipment and Method: Bite block Placement Confirmation: positive ETCO2 Tube secured with: Tape Dental Injury: Teeth and Oropharynx as per pre-operative assessment

## 2019-09-19 NOTE — Anesthesia Preprocedure Evaluation (Signed)
Anesthesia Evaluation  Patient identified by MRN, date of birth, ID band Patient awake    Reviewed: Allergy & Precautions, NPO status , Patient's Chart, lab work & pertinent test results  Airway Mallampati: II  TM Distance: >3 FB Neck ROM: Full    Dental no notable dental hx.    Pulmonary neg pulmonary ROS,    Pulmonary exam normal breath sounds clear to auscultation       Cardiovascular negative cardio ROS Normal cardiovascular exam Rhythm:Regular Rate:Normal     Neuro/Psych negative neurological ROS  negative psych ROS   GI/Hepatic negative GI ROS, Neg liver ROS,   Endo/Other  Morbid obesity  Renal/GU negative Renal ROS  negative genitourinary   Musculoskeletal negative musculoskeletal ROS (+)   Abdominal   Peds negative pediatric ROS (+)  Hematology negative hematology ROS (+)   Anesthesia Other Findings   Reproductive/Obstetrics negative OB ROS                             Anesthesia Physical Anesthesia Plan  ASA: II  Anesthesia Plan: General   Post-op Pain Management:    Induction: Intravenous  PONV Risk Score and Plan: 3 and Ondansetron, Dexamethasone and Treatment may vary due to age or medical condition  Airway Management Planned: LMA  Additional Equipment:   Intra-op Plan:   Post-operative Plan: Extubation in OR  Informed Consent: I have reviewed the patients History and Physical, chart, labs and discussed the procedure including the risks, benefits and alternatives for the proposed anesthesia with the patient or authorized representative who has indicated his/her understanding and acceptance.     Dental advisory given  Plan Discussed with: CRNA and Surgeon  Anesthesia Plan Comments:         Anesthesia Quick Evaluation

## 2019-09-19 NOTE — Discharge Instructions (Signed)
[facility] Office Phone Number [phone]  BREAST BIOPSY/ PARTIAL MASTECTOMY: POST OP INSTRUCTIONS Take 400 mg of ibuprofen every 8 hours or 650 mg tylenol every 6 hours for next 72 hours then as needed. Use ice several times daily also. Always review your discharge instruction sheet given to you by the facility where your surgery was performed.  IF YOU HAVE DISABILITY OR FAMILY LEAVE FORMS, YOU MUST BRING THEM TO THE OFFICE FOR PROCESSING.  DO NOT GIVE THEM TO YOUR DOCTOR.  1. A prescription for pain medication may be given to you upon discharge.  Take your pain medication as prescribed, if needed.  If narcotic pain medicine is not needed, then you may take acetaminophen (Tylenol), naprosyn (Alleve) or ibuprofen (Advil) as needed. 2. Take your usually prescribed medications unless otherwise directed 3. If you need a refill on your pain medication, please contact your pharmacy.  They will contact our office to request authorization.  Prescriptions will not be filled after 5pm or on week-ends. 4. You should eat very light the first 24 hours after surgery, such as soup, crackers, pudding, etc.  Resume your normal diet the day after surgery. 5. Most patients will experience some swelling and bruising in the breast.  Ice packs and a good support bra will help.  Wear the breast binder provided or a sports bra for 72 hours day and night.  After that wear a sports bra during the day until you return to the office. Swelling and bruising can take several days to resolve.  6. It is common to experience some constipation if taking pain medication after surgery.  Increasing fluid intake and taking a stool softener will usually help or prevent this problem from occurring.  A mild laxative (Milk of Magnesia or Miralax) should be taken according to package directions if there are no bowel movements after 48 hours. 7. Unless discharge instructions indicate otherwise, you may remove your bandages 48  hours after surgery and you may shower at that time.  You may have steri-strips (small skin tapes) in place directly over the incision.  These strips should be left on the skin for 7-10 days and will come off on their own.  If your surgeon used skin glue on the incision, you may shower in 24 hours.  The glue will flake off over the next 2-3 weeks.  Any sutures or staples will be removed at the office during your follow-up visit. 8. ACTIVITIES:  You may resume regular daily activities (gradually increasing) beginning the next day.  Wearing a good support bra or sports bra minimizes pain and swelling.  You may have sexual intercourse when it is comfortable. a. You may drive when you no longer are taking prescription pain medication, you can comfortably wear a seatbelt, and you can safely maneuver your car and apply brakes. b. RETURN TO WORK:  ______________________________________________________________________________________ 9. You should see your doctor in the office for a follow-up appointment approximately two weeks after your surgery.  Your doctor's nurse will typically make your follow-up appointment when she calls you with your pathology report.  Expect your pathology report 3-4 business days after your surgery.  You may call to check if you do not hear from us after three days. 10. OTHER INSTRUCTIONS: _______________________________________________________________________________________________ _____________________________________________________________________________________________________________________________________ _____________________________________________________________________________________________________________________________________ _____________________________________________________________________________________________________________________________________  WHEN TO CALL DR WAKEFIELD: 1. Fever over 101.0 2. Nausea and/or vomiting. 3. Extreme swelling or  bruising. 4. Continued bleeding from incision. 5. Increased pain, redness, or drainage from the incision.  The clinic   staff is available to answer your questions during regular business hours.  Please don't hesitate to call and ask to speak to one of the nurses for clinical concerns.  If you have a medical emergency, go to the nearest emergency room or call 911.  A surgeon from [facility] is always on call at the hospital.  For further questions, please visit [email or web address]   NO TYLENOL UNTIL 4:30 PM  NO ADVIL/MOTRIN UNTIL 6:30 PM   Post Anesthesia Home Care Instructions  Activity: Get plenty of rest for the remainder of the day. A responsible individual must stay with you for 24 hours following the procedure.  For the next 24 hours, DO NOT: -Drive a car -Paediatric nurse -Drink alcoholic beverages -Take any medication unless instructed by your physician -Make any legal decisions or sign important papers.  Meals: Start with liquid foods such as gelatin or soup. Progress to regular foods as tolerated. Avoid greasy, spicy, heavy foods. If nausea and/or vomiting occur, drink only clear liquids until the nausea and/or vomiting subsides. Call your physician if vomiting continues.  Special Instructions/Symptoms: Your throat may feel dry or sore from the anesthesia or the breathing tube placed in your throat during surgery. If this causes discomfort, gargle with warm salt water. The discomfort should disappear within 24 hours.  If you had a scopolamine patch placed behind your ear for the management of post- operative nausea and/or vomiting:  1. The medication in the patch is effective for 72 hours, after which it should be removed.  Wrap patch in a tissue and discard in the trash. Wash hands thoroughly with soap and water. 2. You may remove the patch earlier than 72 hours if you experience unpleasant side effects which may include dry mouth, dizziness or  visual disturbances. 3. Avoid touching the patch. Wash your hands with soap and water after contact with the patch.

## 2019-09-19 NOTE — H&P (Signed)
34 yof referred for papilloma left breast. she has family history of bca in her mom who is deceased at age 35. she has all on maternal side history of lung ca in uncle, gastric cancer in an aunt and uncle and gf with prostate cancer. she has prior right breast biopsy that is benign and has been getting mm due to moms history. she has no mass or dc. she had screening mm that shows a density breasts. she has inferior central left breast masses on mm that on [place] there are two adjacent masses measuring 2 mm and 5 mm. nodes are negative. 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 she is referred to discuss this   Past Surgical History  Appendectomy  Breast Biopsy  Bilateral.  Diagnostic Studies History Colonoscopy  never Mammogram  within last year  Allergie No Known Drug Allergies   Medication History  valACYclovir HCl (500MG  Tablet, Oral) Active. Medications Reconciled  Social History  Alcohol use  Occasional alcohol use. Caffeine use  Tea. No drug use  Tobacco use  Never smoker.  Family History  Breast Cancer  Mother.  Pregnancy / Birth History  Age at menarche  20 years. Gravida  2 Irregular periods  Maternal age  9-20 Para  2  Other Problems  Migraine Headache    Review of Systems General Not Present- Appetite Loss, Chills, Fatigue, Fever, Night Sweats, Weight Gain and Weight Loss. Skin Present- Change in Wart/Mole. Not Present- Dryness, Hives, Jaundice, New Lesions, Non-Healing Wounds, Rash and Ulcer. HEENT Present- Wears glasses/contact lenses. Not Present- Earache, Hearing Loss, Hoarseness, Nose Bleed, Oral Ulcers, Ringing in the Ears, Seasonal Allergies, Sinus Pain, Sore Throat, Visual Disturbances and Yellow Eyes. Respiratory Not Present- Bloody sputum, Chronic Cough, Difficulty Breathing, Snoring and Wheezing. Breast Present- Breast Pain. Not Present- Breast Mass, Nipple Discharge and Skin Changes. Cardiovascular Not Present-  Chest Pain, Difficulty Breathing Lying Down, Leg Cramps, Palpitations, Rapid Heart Rate, Shortness of Breath and Swelling of Extremities. Gastrointestinal Not Present- Abdominal Pain, Bloating, Bloody Stool, Change in Bowel Habits, Chronic diarrhea, Constipation, Difficulty Swallowing, Excessive gas, Gets full quickly at meals, Hemorrhoids, Indigestion, Nausea, Rectal Pain and Vomiting. Female Genitourinary Not Present- Frequency, Nocturia, Painful Urination, Pelvic Pain and Urgency. Musculoskeletal Not Present- Back Pain, Joint Pain, Joint Stiffness, Muscle Pain, Muscle Weakness and Swelling of Extremities. Neurological Not Present- Decreased Memory, Fainting, Headaches, Numbness, Seizures, Tingling, Tremor, Trouble walking and Weakness. Psychiatric Not Present- Anxiety, Bipolar, Change in Sleep Pattern, Depression, Fearful and Frequent crying. Endocrine Not Present- Cold Intolerance, Excessive Hunger, Hair Changes, Heat Intolerance, Hot flashes and New Diabetes. Hematology Not Present- Blood Thinners, Easy Bruising, Excessive bleeding, Gland problems, HIV and Persistent Infections.   Physical Exam General Mental Status-Alert. Orientation-Oriented X3. Breast Nipples-No Discharge. Breast Lump-No Palpable Breast Mass. Lymphatic Head & Neck General Head & Neck Lymphatics: Bilateral - Description - Normal. Axillary General Axillary Region: Bilateral - Description - Normal. Note: no [place] adenopathy cv rrr Lungs clear   Assessment & Plan  FAMILY HISTORY OF BREAST CANCER (Z80.3) Story: I calculated her TC risk and is over 20%. she also meets NCCN genetic screening criteria and I think especially with breast cancer at young age in mom and gastric cancer she needs testing. I will refer her to see GC at cancer center. Once we have these results can figure out how best to screen her. I think mr and mm would be reasonable at minimum. she is agreeable to this. 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 I discussed options and she is agreeable. will proceed with seed guided excision as discussed today

## 2019-09-20 ENCOUNTER — Encounter: Payer: Self-pay | Admitting: *Deleted

## 2019-09-23 LAB — SURGICAL PATHOLOGY

## 2019-09-25 ENCOUNTER — Encounter: Payer: Self-pay | Admitting: Licensed Clinical Social Worker

## 2019-09-25 ENCOUNTER — Telehealth: Payer: Self-pay | Admitting: Licensed Clinical Social Worker

## 2019-09-25 DIAGNOSIS — Z1501 Genetic susceptibility to malignant neoplasm of breast: Secondary | ICD-10-CM | POA: Insufficient documentation

## 2019-09-25 DIAGNOSIS — Z1379 Encounter for other screening for genetic and chromosomal anomalies: Secondary | ICD-10-CM | POA: Insufficient documentation

## 2019-09-25 NOTE — Telephone Encounter (Signed)
Revealed BRCA2 mutation identified. Discussed this result briefly. Patient would like to come in person to discuss, she is scheduled to see me 5/10 at 1 pm.

## 2019-10-07 ENCOUNTER — Telehealth: Payer: Self-pay | Admitting: Licensed Clinical Social Worker

## 2019-10-07 ENCOUNTER — Inpatient Hospital Stay: Payer: No Typology Code available for payment source | Admitting: Licensed Clinical Social Worker

## 2019-10-07 NOTE — Telephone Encounter (Signed)
April Valdez called to reschedule her genetic counseling appointment. We rescheduled for a virtual appointment 5/11 at 9 am.

## 2019-10-08 ENCOUNTER — Encounter: Payer: Self-pay | Admitting: Licensed Clinical Social Worker

## 2019-10-08 ENCOUNTER — Inpatient Hospital Stay
Payer: No Typology Code available for payment source | Attending: Licensed Clinical Social Worker | Admitting: Licensed Clinical Social Worker

## 2019-10-08 DIAGNOSIS — Z1509 Genetic susceptibility to other malignant neoplasm: Secondary | ICD-10-CM

## 2019-10-08 DIAGNOSIS — Z8042 Family history of malignant neoplasm of prostate: Secondary | ICD-10-CM

## 2019-10-08 DIAGNOSIS — Z801 Family history of malignant neoplasm of trachea, bronchus and lung: Secondary | ICD-10-CM

## 2019-10-08 DIAGNOSIS — Z800 Family history of malignant neoplasm of digestive organs: Secondary | ICD-10-CM | POA: Diagnosis not present

## 2019-10-08 DIAGNOSIS — Z1379 Encounter for other screening for genetic and chromosomal anomalies: Secondary | ICD-10-CM | POA: Diagnosis not present

## 2019-10-08 DIAGNOSIS — Z803 Family history of malignant neoplasm of breast: Secondary | ICD-10-CM

## 2019-10-08 DIAGNOSIS — Z1502 Genetic susceptibility to malignant neoplasm of ovary: Secondary | ICD-10-CM | POA: Diagnosis not present

## 2019-10-08 DIAGNOSIS — Z1501 Genetic susceptibility to malignant neoplasm of breast: Secondary | ICD-10-CM

## 2019-10-08 NOTE — Progress Notes (Signed)
GENETIC TEST RESULTS  HPI:  April Valdez was previously seen in the [place] clinic due to a family history of cancer and concerns regarding a hereditary predisposition to cancer. Please refer to our prior cancer genetics clinic note for more information regarding our discussion, assessment and recommendations, at the time. April Valdez recent genetic test results were disclosed to her, as were recommendations warranted by these results. These results and recommendations are discussed in more detail below.  CANCER HISTORY:  Oncology History   No history exists.    FAMILY HISTORY:  We obtained a detailed, 4-generation family history.  Significant diagnoses are listed below: Family History  Problem Relation Age of Onset  . Breast cancer Mother 45  . Stroke Maternal Uncle   . Hypertension Maternal Grandfather   . Diabetes Maternal Grandfather   . Prostate cancer Maternal Grandfather   . Hypertension Maternal Uncle   . Pancreatic cancer Maternal Uncle 61  . Hypertension Maternal Aunt   . Anemia Maternal Aunt        low iron  . Diabetes Maternal Aunt   . Pancreatic cancer Maternal Aunt 33  . Lung cancer Maternal Uncle    April Valdez has 2 sons, Glory Rosebush age 70 and Christian age 62, no cancers. She hS 1 maternal half sister, Ashford, 27, no history of cancer.   April Valdez does not have information about her paternal side of the family. Her father is living and there is no cancer on this side that she is aware of.   April Valdez mother was diagnosed with breast cancer at 76 and died at 71. Patient had 4 maternal uncles and 1 maternal aunt. Her aunt was diagnosed with stomach cancer at 27 and died at 8. An uncle was diagnosed with stomach cancer at 52 and died at 54. Another uncle was diagnosed with lung cancer at 22 and had history of smoking. A maternal cousin had throat cancer and history of smoking. Maternal grandfather is living at 62 and had history of prostate cancer.  Maternal grandmother died of a heart attack at 69.  UPDATE: Patient reports the maternal uncle and aunt with stomach cancer actually had pancreatic cancer.   April Valdez is unaware of previous family history of genetic testing for hereditary cancer risks. Patient's maternal ancestors are of African American descent, and paternal ancestors are of African American descent. There is no reported Ashkenazi Jewish ancestry. There is no known consanguinity.   GENETIC TEST RESULTS: Genetic testing reported out on 09/25/2019  through the [facility] Common Hereditary cancer panel identified a single, pathogenic variant in BRCA2 called c.[id number] (p.Val220Ilefs*4). The remainder of testing was negative.  The Common Hereditary Cancers Panel offered by [facility] includes sequencing and/or deletion duplication testing of the following 48 genes: APC, ATM, AXIN2, BARD1, BMPR1A, BRCA1, BRCA2, BRIP1, CDH1, CDKN2A (p14ARF), CDKN2A (p16INK4a), CKD4, CHEK2, CTNNA1, DICER1, EPCAM (Deletion/duplication testing only), GREM1 (promoter region deletion/duplication testing only), KIT, MEN1, MLH1, MSH2, MSH3, MSH6, MUTYH, NBN, NF1, NHTL1, PALB2, PDGFRA, PMS2, POLD1, POLE, PTEN, RAD50, RAD51C, RAD51D, RNF43, SDHB, SDHC, SDHD, SMAD4, SMARCA4. STK11, TP53, TSC1, TSC2, and VHL.  The following genes were evaluated for sequence changes only: SDHA and HOXB13 c.251G>A variant only.  The test report has been scanned into EPIC and is located under the Molecular Pathology section of the Results Review tab.  A portion of the result report is included below for reference.      DISCUSSION: BRCA2   We discussed the cancers, inheritance, management asscociated  with BRCA2, and the importance of telling family members about this result.   HBOC syndrome is characterized by an increased lifetime risk for generally adult-onset cancers including breast, contralateral breast, female breast, ovarian, prostate and pancreatic.   Cancers associated with  BRCA2:  -Breast cancer, up to a 84% risk (PMID: 9030092 ) -Ovarian cancer, up to a 27% risk (PMID: 3300762) -Pancreatic cancer, 2-7% (PMID: 26333545, 62563893, 73428768, 11572620) -Prostate cancer, elevated (35597416, 38453646) -Melanoma, elevated (PMID: 80321224, 82500370)  Inheritance Hereditary predisposition to cancer due to pathogenic variants in the BRCA2 gene has autosomal dominant inheritance. This means that an individual with a pathogenic variant has a 50% chance of passing the condition on to his/her offspring. Most cases are inherited from a parent, but some cases may occur spontaneously (i.e., an individual with a pathogenic variant has parents who do not have it). Identification of a pathogenic variant allows for the recognition of at-risk relatives who can pursue testing for the familial variant.  Individuals with a single pathogenic BRCA2 variant are also carriers of autosomal recessive Fanconi anemia. Fanconi anemia is characterized by bone marrow failure with variable additional anomalies, which often include short stature, abnormal skin pigmentation, abnormal thumbs, malformations of the skeletal and central nervous systems, and developmental delay (PMID: 4888916, 94503888). Risk of leukemia and early-onset solid tumors is significantly elevated with this disorder (PMID: 28003491, 79150569, 79480165). For there to be a risk of Fanconi anemia in offspring, both the patient and their partner would each have to carry a pathogenic variant in BRCA2; in this case, the risk to have an affected child is 25%.   Management:    Breast cancer -clinical breast exams every 6-12 months beginning at 56 -age 101-29: annual breast MRI screening with contrast -age 106-75: annual mammogram with consideration of tomosynthesis and breast MRI screening with contrast -For women treated for breast cancer, screening of remaining breast tissue with annual mammography and breast MRI should continue (if they  have not had bilateral mastectomy) -Consider option of risk-reducing mastectomy   Ovarian cancer -Recommend risk-reducing salpingo-oopherectomy (RRSO) typically between the ages of 50-40 and upon completion of childbearing BRCA2: Can delay until 65-45) -For patients who do not elect RRSO, transvaginal ultrasound combined with serum CA-125 for ovarian cancer screening has not been shown to be sufficiently sensitive or specific as to support a positive recommendation, but, although of uncertain benefit, may be considered at clinician's discretion starting at age 50-35 years   Pancreatic cancer -For individuals with a pathogenic/likely pathogenic variant in one of the pancreatic cancer susceptibility genes: -Consider pancreatic cancer screening beginning at age 72 (or 71 years younger than earliest exocrine pancreatic cancer diagnosis in the family, whichever is earlier), for individuals with exocrine pancreatic cancer in 1 or more first or second degree relatives from the same side of (or presumed to be from same side of) family as the identified pathogenic/likely pathogenic variant -The panel does not currently recommend pancreatic cancer screening for carrier of mutations in genes other than STK11 and CDKN2A in the absence of a close family history of exocrine pancreatic cancer  Of note, April Valdez does report 2 maternal relatives with pancreatic cancer, one diagnosed at 45.    Melanoma -There are no specific NCCN guidelines regarding melanoma screening for individuals with BRCA mutations.  -However, sun protection is recommended, and routine skin exams by a dermatologist can be considered.     These guidelines are based on current NCCN guidelines (NCCN v.2.2021).  These guidelines are subject  to change and continually updated and should be directly referenced for future medical management.      Risk Reduction:   -Chemoprevention can reduce the risk of breast cancer in the contralateral breast  in women with BRCA1 and BRCA2 mutations who have been diagnosed with breast cancer (PMID: 7628315, 17616073). -Oral contraceptive use has been shown to reduce the risk of ovarian cancer by approximately 60% in BRCA mutation carriers if taken for at least 5 years (PMID: 7106269). -Recent studies have some preliminary data that suggest PARP inhibitors may be a beneficial chemotherapeutic agent for a subset of patients with BRCA2-associated breast and ovarian cancers. Clinical trials are currently in process to determine if and how this agent can be useful in the treatment of BRCA2 cancer patients (PMID: 48546270).  An individual's cancer risk and medical management are not determined by genetic test results alone. Overall cancer risk assessment incorporates additional factors including personal medical history, family history, as well as available genetic information that may result in a personalized plan for cancer prevention and surveillance.  It is advantageous to know if a BRCA2 pathogenic variant is present as medical management recommendations can be implemented. At-risk relatives can be identified, allowing pursuit of a diagnostic evaluation. In addition, the available information regarding hereditary cancer susceptibility genes is constantly evolving and more clinically relevant BRCA2 data is likely to become available in the near future. Awareness of this cancer predisposition allows patients and their providers to be vigilant in maintaining close and regular contact with their local genetics clinic in anticipation of new information, inform at-risk family members, and diligently follow condition-specific screening protocols.  FAMILY MEMBERS: It is important that all of April Valdez's relatives (both men and women) know of the presence of this gene mutation. Site-specific genetic testing can sort out who in the family is at risk and who is not.   April Valdez children are have a 50% chance to have  inherited this mutation. However, they are relatively young and this will not be of any consequence to them for several years. We do not test children because there is no risk to them until they are adults. We recommend they have genetic counseling and testing by the time they are in their early 20s.    April Valdez half sister has a 50% chance to have inherited this mutation since it is likely this is coming from patient's mother. We recommend they have genetic testing for this same mutation, as identifying the presence of this mutation would allow them to also take advantage of risk-reducing measures.   PLAN:   1. These results will be made available to Dr. Donne Hazel and patient's gynecologist, Dr. Florene Glen.  She also accepted a referral to the high risk breast clinic here at the cancer center. She would like Dr. Florene Glen and the high risk providers to follow her long-term for this indication and coordinate screening/prophylactic surgeries.    2. April Valdez plans to discuss these results with her family and will reach out to [place] if we can be of any assistance in coordinating genetic testing for any of her relatives. She gives [place] permission to discuss her results with sister Ethelene Hal.    SUPPORT AND RESOURCES: If April Valdez is interested in BRCA-specific information and support, there are two groups, Facing Our Risk (www.facingourrisk.com) and Bright Pink (www.brightpink.org) which some people have found useful. They provide opportunities to speak with other individuals from high-risk families. To locate genetic counselors in other cities, visit the  website of the [employer] (ArtistMovie.se) and Secretary/administrator for a counselor by zip code.  We encouraged April Valdez to remain in contact with [place] on an annual basis so we can update her personal and family histories, and let her know of advances in cancer genetics that may benefit the family. Our contact number was provided. April Valdez  questions were answered to her satisfaction today, and she knows she is welcome to call anytime with additional questions.   Faith Rogue, MS, [facility] Genetic Counselor [email or web address] Phone: [phone]

## 2019-10-16 ENCOUNTER — Other Ambulatory Visit: Payer: Self-pay | Admitting: General Surgery

## 2019-10-16 DIAGNOSIS — Z1501 Genetic susceptibility to malignant neoplasm of breast: Secondary | ICD-10-CM

## 2019-11-06 ENCOUNTER — Ambulatory Visit
Admission: RE | Admit: 2019-11-06 | Discharge: 2019-11-06 | Disposition: A | Discharge status: Home / Self Care | Payer: No Typology Code available for payment source | Source: Ambulatory Visit | Attending: General Surgery | Admitting: General Surgery

## 2019-11-06 ENCOUNTER — Other Ambulatory Visit: Payer: Self-pay

## 2019-11-06 DIAGNOSIS — Z1501 Genetic susceptibility to malignant neoplasm of breast: Secondary | ICD-10-CM

## 2019-11-06 MED ORDER — GADOBUTROL 1 MMOL/ML IV SOLN
10.0000 mL | Freq: Once | INTRAVENOUS | Status: AC | PRN
  Administered 2019-11-06: 10 mL via INTRAVENOUS

## 2019-11-08 ENCOUNTER — Other Ambulatory Visit: Payer: Self-pay | Admitting: General Surgery

## 2019-11-08 ENCOUNTER — Other Ambulatory Visit: Payer: Self-pay | Admitting: Obstetrics and Gynecology

## 2019-11-08 DIAGNOSIS — R2232 Localized swelling, mass and lump, left upper limb: Secondary | ICD-10-CM

## 2019-11-08 DIAGNOSIS — Z1501 Genetic susceptibility to malignant neoplasm of breast: Secondary | ICD-10-CM

## 2019-11-11 ENCOUNTER — Telehealth: Payer: Self-pay | Admitting: Hematology and Oncology

## 2019-11-11 NOTE — Telephone Encounter (Signed)
Ms. April Valdez has been cld and scheduled to see Dr. Lindi Adie for the high risk breast clinic on 7/8 at 1pm. Pt aware toa rrive 15 minutes early.

## 2019-11-13 ENCOUNTER — Ambulatory Visit
Admission: RE | Admit: 2019-11-13 | Discharge: 2019-11-13 | Disposition: A | Discharge status: Home / Self Care | Payer: No Typology Code available for payment source | Source: Ambulatory Visit | Attending: General Surgery | Admitting: General Surgery

## 2019-11-13 ENCOUNTER — Other Ambulatory Visit: Payer: Self-pay

## 2019-11-13 ENCOUNTER — Other Ambulatory Visit: Payer: Self-pay | Admitting: General Surgery

## 2019-11-13 DIAGNOSIS — Z1501 Genetic susceptibility to malignant neoplasm of breast: Secondary | ICD-10-CM

## 2019-11-13 DIAGNOSIS — R2232 Localized swelling, mass and lump, left upper limb: Secondary | ICD-10-CM

## 2019-11-13 DIAGNOSIS — R599 Enlarged lymph nodes, unspecified: Secondary | ICD-10-CM

## 2019-11-14 ENCOUNTER — Other Ambulatory Visit: Payer: Self-pay | Admitting: General Surgery

## 2019-11-14 DIAGNOSIS — R9389 Abnormal findings on diagnostic imaging of other specified body structures: Secondary | ICD-10-CM

## 2019-11-21 ENCOUNTER — Ambulatory Visit
Admission: RE | Admit: 2019-11-21 | Discharge: 2019-11-21 | Disposition: A | Discharge status: Home / Self Care | Payer: No Typology Code available for payment source | Source: Ambulatory Visit | Attending: General Surgery | Admitting: General Surgery

## 2019-11-21 ENCOUNTER — Other Ambulatory Visit (HOSPITAL_COMMUNITY): Payer: Self-pay | Admitting: Diagnostic Radiology

## 2019-11-21 ENCOUNTER — Other Ambulatory Visit: Payer: Self-pay

## 2019-11-21 DIAGNOSIS — R9389 Abnormal findings on diagnostic imaging of other specified body structures: Secondary | ICD-10-CM

## 2019-11-21 MED ORDER — GADOBUTROL 1 MMOL/ML IV SOLN
10.0000 mL | Freq: Once | INTRAVENOUS | Status: AC | PRN
  Administered 2019-11-21: 10 mL via INTRAVENOUS

## 2019-12-04 NOTE — Progress Notes (Signed)
[facility] CONSULT NOTE  Patient Care Team: Patient, No Pcp Per as PCP - General (General Practice) Rolm Bookbinder, MD as Consulting Physician (General Surgery)  CHIEF COMPLAINTS/PURPOSE OF CONSULTATION:  Newly diagnosed high risk for breast cancer  HISTORY OF PRESENTING ILLNESS:  April Valdez 34 y.o. female is here because of recent diagnosis of high risk for breast cancer, and positive for BRCA2 gene mutation. She has a family history of breast cancer in her mother, who passed away at 61yo from breast cancer. Screening mammogram detected possible left breast masses, 0.2cm and 0.5cm at the 7 o'clock position in the left breast. 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 She underwent a left lumpectomy on 09/19/19 with Dr. Donne Hazel for which pathology showed intraductal papilloma and no evidence of malignancy. Breast MRI on 11/06/19 showed a 4.4cm area of non-mass enhancement in the right breast and enlarged left axillary lymph nodes. Right breast biopsy on 11/21/19 showed a complex sclerosing lesion and calcifications. She presents to the clinic today for initial evaluation and discussion of surveillance options.   I reviewed her records extensively and collaborated the history with the patient.  SUMMARY OF ONCOLOGIC HISTORY: Oncology History   No history exists.    MEDICAL HISTORY:  Past Medical History:  Diagnosis Date  . Family history of breast cancer   . Family history of lung cancer   . Family history of pancreatic cancer   . Family history of prostate cancer   . Family history of stomach cancer   . Headache, migraine   . History of PCOS     SURGICAL HISTORY: Past Surgical History:  Procedure Laterality Date  . APPENDECTOMY    . CESAREAN SECTION     x2  . RADIOACTIVE SEED GUIDED EXCISIONAL BREAST BIOPSY Left 09/19/2019   Procedure: LEFT BREAST RADIOACTIVE SEED GUIDED EXCISIONAL BREAST BIOPSY;  Surgeon: Rolm Bookbinder, MD;  Location: [facility];  Service: General;  Laterality: Left;  LMA  . TUBAL LIGATION      SOCIAL HISTORY: Social History   Socioeconomic History  . Marital status: Single    Spouse name: Not on file  . Number of children: Not on file  . Years of education: Not on file  . Highest education level: Not on file  Occupational History  . Not on file  Tobacco Use  . Smoking status: Never Smoker  . Smokeless tobacco: Never Used  Vaping Use  . Vaping Use: Never used  Substance and Sexual Activity  . Alcohol use: No  . Drug use: No  . Sexual activity: Yes    Birth control/protection: Surgical    Comment: BTL  Other Topics Concern  . Not on file  Social History Narrative  . Not on file   Social Determinants of Health   Financial Resource Strain:   . Difficulty of Paying Living Expenses:   Food Insecurity:   . Worried About Charity fundraiser in the Last Year:   . Arboriculturist in the Last Year:   Transportation Needs:   . Film/video editor (Medical):   Marland Kitchen Lack of Transportation (Non-Medical):   Physical Activity:   . Days of Exercise per Week:   . Minutes of Exercise per Session:   Stress:   . Feeling of Stress :   Social Connections:   . Frequency of Communication with Friends and Family:   . Frequency of Social Gatherings with Friends and Family:   . Attends Religious Services:   .  Active Member of Clubs or Organizations:   . Attends Archivist Meetings:   Marland Kitchen Marital Status:   Intimate Partner Violence:   . Fear of Current or Ex-Partner:   . Emotionally Abused:   Marland Kitchen Physically Abused:   . Sexually Abused:     FAMILY HISTORY: Family History  Problem Relation Age of Onset  . Breast cancer Mother 20  . Stroke Maternal Uncle   . Hypertension Maternal Grandfather   . Diabetes Maternal Grandfather   . Prostate cancer Maternal Grandfather   . Hypertension Maternal Uncle   . Pancreatic cancer Maternal Uncle 61  . Hypertension Maternal Aunt   . Anemia  Maternal Aunt        low iron  . Diabetes Maternal Aunt   . Pancreatic cancer Maternal Aunt 44  . Lung cancer Maternal Uncle     ALLERGIES:  has No Known Allergies.  MEDICATIONS:  No current outpatient medications on file.   No current facility-administered medications for this visit.    REVIEW OF SYSTEMS:   Constitutional: Denies fevers, chills or abnormal night sweats Eyes: Denies blurriness of vision, double vision or watery eyes Ears, nose, mouth, throat, and face: Denies mucositis or sore throat Respiratory: Denies cough, dyspnea or wheezes Cardiovascular: Denies palpitation, chest discomfort or lower extremity swelling Gastrointestinal:  Denies nausea, heartburn or change in bowel habits Skin: Denies abnormal skin rashes Lymphatics: Denies new lymphadenopathy or easy bruising Neurological:Denies numbness, tingling or new weaknesses Behavioral/Psych: Mood is stable, no new changes  Breast: Denies any palpable lumps or discharge All other systems were reviewed with the patient and are negative.  PHYSICAL EXAMINATION: ECOG PERFORMANCE STATUS: 1 - Symptomatic but completely ambulatory  Vitals:   12/05/19 1308  BP: 119/67  Pulse: 69  Resp: 18  Temp: 99 F (37.2 C)  SpO2: 100%   Filed Weights   12/05/19 1308  Weight: 230 lb 11.2 oz (104.6 kg)    GENERAL:alert, no distress and comfortable SKIN: skin color, texture, turgor are normal, no rashes or significant lesions EYES: normal, conjunctiva are pink and non-injected, sclera clear OROPHARYNX:no exudate, no erythema and lips, buccal mucosa, and tongue normal  NECK: supple, thyroid normal size, non-tender, without nodularity LYMPH:  no palpable lymphadenopathy in the cervical, axillary or inguinal LUNGS: clear to auscultation and percussion with normal breathing effort HEART: regular rate & rhythm and no murmurs and no lower extremity edema ABDOMEN:abdomen soft, non-tender and normal bowel sounds Musculoskeletal:no  cyanosis of digits and no clubbing  PSYCH: alert & oriented x 3 with fluent speech NEURO: no focal motor/sensory deficits BREAST: No palpable nodules in breast. No palpable axillary or supraclavicular lymphadenopathy (exam performed in the presence of a chaperone)   LABORATORY DATA:  I have reviewed the data as listed Lab Results  Component Value Date   WBC 4.6 12/06/2015   HGB 11.0 (L) 12/06/2015   HCT 33.4 (L) 12/06/2015   MCV 81.7 12/06/2015   PLT 227 12/06/2015   Lab Results  Component Value Date   NA 135 12/06/2015   K 3.7 12/06/2015   CL 105 12/06/2015   CO2 26 12/06/2015    RADIOGRAPHIC STUDIES: I have personally reviewed the radiological reports and agreed with the findings in the report.  ASSESSMENT AND PLAN:  BRCA2 gene mutation positive in female single, pathogenic variant in BRCA2 called c.[id number] (p.Val220Ilefs*4): I discussed with the patient that BRCA2 is a tumor suppressor gene which helps repair damaged DNA or destroy cells if they cannot be  repaired. In patients with BRCA1 or 2 mutations, the damaged DNA could not be repaired properly increasing the risk of cancers. BRCA2 gene is located on long arm of chromosome 13. These mutations are inherited in autosomal dominant fashion and hence 50% probability that their children may have a BRCA mutation.  Cancer risk:  Average to risk of cancer by age of 35: (Antoniou et al pooled pedigree data from 53 studies of 8139 index patients with breast or ovarian cancer) Breast cancer risk: 45 percent (95% CI, 33 to 54 percent) Ovarian cancer risk: 11 percent (95% CI, 4.1 to 18 percent)  [place] study: Tumor limited to risk by age of 66: Breast cancer risk: 64 percent (95% CI, 41 to 70 percent) Ovarian cancer risk: 16.5 percent (95% CI, 7.5 to 34 percent)  I discussed the difference between BRCA1 and BRCA2 wherein patients BRCA1 patients have early onset disease and much higher risk of breast cancer. The mean age of diagnosis  of breast cancer between BRCA1 and 2 are 54 versus 57 years, risk of ovarian cancer is also higher with BRCA1 but the overall risk under the age of 40 is very low.  Other cancer risks:  1. Pancreatic cancer (relative risk is 3.51) with incidence of 4.9% in BRCA2 carriers 2. Fallopian tube carcinoma and primary peritoneal carcinoma 3. Uterine papillary serous carcinoma: Overall risk is very low 4. Colorectal cancers: In many studies there was no increased risk But there may be some for BRCA1 carriers 5. Melanoma and other skin cancers: The risk of oatmeal melanoma is increase in BRCA2 mutation carriers but it still extremely rare. 6. Endometrial cancer: Not very clear in terms of risks it is thought to be very low  Breast cancer risk reduction/surveillance: 1. Annual mammogram and breast MRI are recommended by NCCN  2. Chemoprevention with tamoxifen-like agents is not entirely clear. Based on NSABP P1 study in the small cohort of patients with BRCA1 mutations, tamoxifen to reduce breast cancer risk by 62% and BRCA2 carriers. The study is limited because of small numbers. 3. After childbearing, evaluation for prophylactic bilateral mastectomy is an option. 4. Oophorectomy reduces the risk of breast cancer by 50% 5. Breast self-examinations starting at age 46   Ovarian cancer risk reduction: 1. Risk reducing bilateral salpingo-oophorectomy between the ages of 13-40 once childbearing is complete is recommended. In one study that was 72% reduction of risk of ovarian cancer and a decrease in breast cancer by approximately 50% 2. There is no clear data for surveillance with CA125 or vaginal ultrasounds 2. Oral contraception dose may be protective against ovarian cancer but it may slightly increase risk of breast cancer.  Left lumpectomy: 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 She  will also likely need evaluation for the axillary lymph node.  Return to clinic in 3 months for follow-up.   All questions were answered. The patient knows to call the clinic with any problems, questions or concerns.   Rulon Eisenmenger, MD, MPH 12/05/2019    I, Molly Dorshimer, am acting as scribe for Nicholas Lose, MD.  I have reviewed the above documentation for accuracy and completeness, and I agree with the above.

## 2019-12-05 ENCOUNTER — Other Ambulatory Visit: Payer: Self-pay

## 2019-12-05 ENCOUNTER — Inpatient Hospital Stay
Payer: No Typology Code available for payment source | Attending: Licensed Clinical Social Worker | Admitting: Hematology and Oncology

## 2019-12-05 ENCOUNTER — Telehealth: Payer: Self-pay | Admitting: Hematology and Oncology

## 2019-12-05 DIAGNOSIS — Z801 Family history of malignant neoplasm of trachea, bronchus and lung: Secondary | ICD-10-CM | POA: Insufficient documentation

## 2019-12-05 DIAGNOSIS — Z803 Family history of malignant neoplasm of breast: Secondary | ICD-10-CM | POA: Insufficient documentation

## 2019-12-05 DIAGNOSIS — Z8042 Family history of malignant neoplasm of prostate: Secondary | ICD-10-CM | POA: Diagnosis not present

## 2019-12-05 DIAGNOSIS — Z1509 Genetic susceptibility to other malignant neoplasm: Secondary | ICD-10-CM | POA: Diagnosis not present

## 2019-12-05 DIAGNOSIS — Z800 Family history of malignant neoplasm of digestive organs: Secondary | ICD-10-CM | POA: Diagnosis not present

## 2019-12-05 DIAGNOSIS — Z1502 Genetic susceptibility to malignant neoplasm of ovary: Secondary | ICD-10-CM

## 2019-12-05 DIAGNOSIS — Z1501 Genetic susceptibility to malignant neoplasm of breast: Secondary | ICD-10-CM | POA: Diagnosis not present

## 2019-12-05 DIAGNOSIS — Z1379 Encounter for other screening for genetic and chromosomal anomalies: Secondary | ICD-10-CM

## 2019-12-05 MED ORDER — TAMOXIFEN CITRATE 20 MG PO TABS: 20.0000 mg | ORAL_TABLET | Freq: Every day | ORAL | 3 refills | Status: DC

## 2019-12-05 NOTE — Assessment & Plan Note (Signed)
single, pathogenic variant in BRCA2 called [id number] (p.Val220Ilefs*4): I discussed with the patient that BRCA2 is a tumor suppressor gene which helps repair damaged DNA or destroy cells if they cannot be repaired. In patients with BRCA1 or 2 mutations, the damaged DNA could not be repaired properly increasing the risk of cancers. BRCA2 gene is located on long arm of chromosome 13. These mutations are inherited in autosomal dominant fashion and hence 50% probability that their children may have a BRCA mutation.  Cancer risk:  Average to risk of cancer by age of 67: (Antoniou et al pooled pedigree data from 21 studies of 8139 index patients with breast or ovarian cancer) Breast cancer risk: 45 percent (95% CI, 33 to 54 percent) Ovarian cancer risk: 11 percent (95% CI, 4.1 to 18 percent)  [place] study: Tumor limited to risk by age of 61: Breast cancer risk: 32 percent (95% CI, 41 to 70 percent) Ovarian cancer risk: 16.5 percent (95% CI, 7.5 to 34 percent)  I discussed the difference between BRCA1 and BRCA2 wherein patients BRCA1 patients have early onset disease and much higher risk of breast cancer. The mean age of diagnosis of breast cancer between BRCA1 and 2 are 57 versus 9 years, risk of ovarian cancer is also higher with BRCA1 but the overall risk under the age of 59 is very low.  Other cancer risks:  1. Pancreatic cancer (relative risk is 3.51) with incidence of 4.9% in BRCA2 carriers 2. Fallopian tube carcinoma and primary peritoneal carcinoma 3. Uterine papillary serous carcinoma: Overall risk is very low 4. Colorectal cancers: In many studies there was no increased risk But there may be some for BRCA1 carriers 5. Melanoma and other skin cancers: The risk of oatmeal melanoma is increase in BRCA2 mutation carriers but it still extremely rare. 6. Endometrial cancer: Not very clear in terms of risks it is thought to be very low  Breast cancer risk reduction/surveillance: 1. Annual  mammogram and breast MRI are recommended by NCCN  2. Chemoprevention with tamoxifen-like agents is not entirely clear. Based on NSABP P1 study in the small cohort of patients with BRCA1 mutations, tamoxifen to reduce breast cancer risk by 62% and BRCA2 carriers. The study is limited because of small numbers. 3. After childbearing, evaluation for prophylactic bilateral mastectomy is an option. 4. Oophorectomy reduces the risk of breast cancer by 50% 5. Breast self-examinations starting at age 51   Ovarian cancer risk reduction: 1. Risk reducing bilateral salpingo-oophorectomy between the ages of 62-40 once childbearing is complete is recommended. In one study that was 72% reduction of risk of ovarian cancer and a decrease in breast cancer by approximately 50% 2. There is no clear data for surveillance with CA125 or vaginal ultrasounds 2. Oral contraception dose may be protective against ovarian cancer but it may slightly increase risk of breast cancer.

## 2019-12-05 NOTE — Telephone Encounter (Signed)
Scheduled appt per 7/8 los. Pt declined print out of AVS.

## 2019-12-31 ENCOUNTER — Encounter: Payer: Self-pay | Admitting: Genetic Counselor

## 2020-01-15 ENCOUNTER — Other Ambulatory Visit: Payer: Self-pay

## 2020-01-15 ENCOUNTER — Ambulatory Visit
Admission: RE | Admit: 2020-01-15 | Discharge: 2020-01-15 | Disposition: A | Discharge status: Home / Self Care | Payer: No Typology Code available for payment source | Source: Ambulatory Visit | Attending: General Surgery | Admitting: General Surgery

## 2020-01-15 ENCOUNTER — Ambulatory Visit: Admission: RE | Admit: 2020-01-15 | Payer: No Typology Code available for payment source | Source: Ambulatory Visit

## 2020-01-15 DIAGNOSIS — R599 Enlarged lymph nodes, unspecified: Secondary | ICD-10-CM

## 2020-01-15 DIAGNOSIS — Z1501 Genetic susceptibility to malignant neoplasm of breast: Secondary | ICD-10-CM

## 2020-01-31 ENCOUNTER — Other Ambulatory Visit: Payer: Self-pay | Admitting: General Surgery

## 2020-01-31 DIAGNOSIS — N6489 Other specified disorders of breast: Secondary | ICD-10-CM

## 2020-02-04 ENCOUNTER — Other Ambulatory Visit: Payer: Self-pay | Admitting: General Surgery

## 2020-02-04 DIAGNOSIS — N6489 Other specified disorders of breast: Secondary | ICD-10-CM

## 2020-02-14 ENCOUNTER — Encounter: Payer: Self-pay | Admitting: Gastroenterology

## 2020-02-14 ENCOUNTER — Ambulatory Visit (INDEPENDENT_AMBULATORY_CARE_PROVIDER_SITE_OTHER): Payer: No Typology Code available for payment source | Admitting: Gastroenterology

## 2020-02-14 VITALS — BP 120/70 | HR 80 | Ht 60.0 in | Wt 233.4 lb

## 2020-02-14 DIAGNOSIS — Z1509 Genetic susceptibility to other malignant neoplasm: Secondary | ICD-10-CM

## 2020-02-14 DIAGNOSIS — Z1501 Genetic susceptibility to malignant neoplasm of breast: Secondary | ICD-10-CM | POA: Diagnosis not present

## 2020-02-14 DIAGNOSIS — Z803 Family history of malignant neoplasm of breast: Secondary | ICD-10-CM

## 2020-02-14 DIAGNOSIS — Z800 Family history of malignant neoplasm of digestive organs: Secondary | ICD-10-CM | POA: Diagnosis not present

## 2020-02-14 NOTE — Patient Instructions (Signed)
If you are age 35 or older, your body mass index should be between 23-30. Your Body mass index is 45.58 kg/m. If this is out of the aforementioned range listed, please consider follow up with your Primary Care Provider.  If you are age 7 or younger, your body mass index should be between 19-25. Your Body mass index is 45.58 kg/m. If this is out of the aformentioned range listed, please consider follow up with your Primary Care Provider.   You have been scheduled for an MRI/MRCP of abdomen at [facility] on 02/24/20. Your appointment time is 9 am. Please arrive 30 minutes prior to your appointment time for registration purposes. Please make certain not to have anything to eat or drink 6 hours prior to your test. In addition, if you have any metal in your body, have a pacemaker or defibrillator, please be sure to let your ordering physician know. This test typically takes 45 minutes to 1 hour to complete. Should you need to reschedule, please call [phone] to do so.  Your provider has requested that you go to the basement level for lab work at [facility]. [place], [place] . Press "B" on the elevator. The lab is located at the first door on the left as you exit the elevator. MUST BE FASTING BEFORE LABS DONE - NOTHING TO EAT OR DRINK AFTER MIDNIGHT THE DAY BEFORE.    We will call you with results.  Follow up with me in six months.  Please call the office as the schedule is not available at this time.  It was a pleasure to see you today!  Vito Cirigliano, D.O.

## 2020-02-14 NOTE — Progress Notes (Signed)
Chief Complaint: Pancreatic Cancer Screening, family history of Pancreatic Cancer, family history of Stomach Cancer  Referring Provider:     Rolm Bookbinder, MD ; Nicholas Lose, MD   HPI:     April Valdez is a 34 y.o. female referred to the Gastroenterology Clinic to discuss possible Pancreatic cancer screening due to family history and recent genetic mutation as outlined below.  She is otherwise without any GI symptoms no complaints today.  She was recently diagnosed with BRCA2 gene mutation and has family history notable for mother with Breast Cancer, who passed away at age 2.  Recent screening mammogram detected possible left breast masses with biopsy demonstrating ductal papilloma.  Subsequent left lumpectomy on 09/19/2019 by Dr. Donne Hazel demonstrated intraductal papilloma and no evidence of malignancy.  MRI breast on 11/06/2019 with 4.4 cm area of non-mass enhancement in the right breast and enlarged left axillary lymph nodes.  Right breast biopsy on 11/21/2019 demonstrates complex sclerosing lesion calcifications. Started Tamoxifen July 2021 with plan for 5 years. Upcoming right breast radioactive seed implant next month.    Recently seen in the Oncology clinic on 12/05/2019.  Family history as outlined below notable for mother with breast cancer (58), maternal uncle with pancreatic cancer (2), maternal aunt with pancreatic cancer (21), maternal uncle with lung cancer, maternal GF with prostate cancer.  Genetic testing 09/18/2019: BRCA2 gene mutation positive in female single, pathogenic variant in BRCA2 [id number] (p.Val220Ilefs*4):   Past Medical History:  Diagnosis Date  . Family history of breast cancer   . Family history of lung cancer   . Family history of pancreatic cancer   . Family history of prostate cancer   . Family history of stomach cancer   . Headache, migraine   . History of PCOS      Past Surgical History:  Procedure Laterality  Date  . APPENDECTOMY    . CESAREAN SECTION     x2  . RADIOACTIVE SEED GUIDED EXCISIONAL BREAST BIOPSY Left 09/19/2019   Procedure: LEFT BREAST RADIOACTIVE SEED GUIDED EXCISIONAL BREAST BIOPSY;  Surgeon: Rolm Bookbinder, MD;  Location: [facility];  Service: General;  Laterality: Left;  LMA  . TUBAL LIGATION     Family History  Problem Relation Age of Onset  . Breast cancer Mother 68  . Stroke Maternal Uncle   . Hypertension Maternal Grandfather   . Diabetes Maternal Grandfather   . Prostate cancer Maternal Grandfather   . Hypertension Maternal Uncle   . Pancreatic cancer Maternal Uncle 61  . Hypertension Maternal Aunt   . Anemia Maternal Aunt        low iron  . Diabetes Maternal Aunt   . Pancreatic cancer Maternal Aunt 38  . Lung cancer Maternal Uncle   . Esophageal cancer Neg Hx   . Colon cancer Neg Hx    Social History   Tobacco Use  . Smoking status: Never Smoker  . Smokeless tobacco: Never Used  Vaping Use  . Vaping Use: Never used  Substance Use Topics  . Alcohol use: No  . Drug use: No   Current Outpatient Medications  Medication Sig Dispense Refill  . Multiple Vitamins-Minerals (MULTIVITAMIN GUMMIES ADULT PO) Take 2 tablets by mouth daily.    . tamoxifen (NOLVADEX) 20 MG tablet Take 1 tablet (20 mg total) by mouth daily. 90 tablet 3   No current facility-administered medications for this visit.   No Known Allergies  Review of Systems: All systems reviewed and negative except where noted in HPI.     Physical Exam:    Wt Readings from Last 3 Encounters:  02/14/20 233 lb 6 oz (105.9 kg)  12/05/19 230 lb 11.2 oz (104.6 kg)  09/19/19 230 lb 13.2 oz (104.7 kg)    BP 120/70   Pulse 80   Ht 5' (1.524 m)   Wt 233 lb 6 oz (105.9 kg)   BMI 45.58 kg/m  Constitutional:  Pleasant, in no acute distress. Psychiatric: Normal mood and affect. Behavior is normal. EENT: Pupils normal.  Conjunctivae are normal. No scleral icterus. Neck supple.  No cervical LAD. Cardiovascular: Normal rate, regular rhythm. No edema Pulmonary/chest: Effort normal and breath sounds normal. No wheezing, rales or rhonchi. Abdominal: Soft, nondistended, nontender. Bowel sounds active throughout. There are no masses palpable. No hepatomegaly. Neurological: Alert and oriented to person place and time. Skin: Skin is warm and dry. No rashes noted.   ASSESSMENT AND PLAN;   1) Family History of Pancreatic Cancer: 2) BRCA2 gene mutation 3) Family history of Breast Cancer  We extensively reviewed the most recent [place] and AGA guidelines re: screening for pancreatic cancer and high risk individuals.  Given her mother's unfortunate early death at age 18 from Breast Cancer, we do not know whether or not she would/could have developed Pancreatic Cancer, which in turn would have placed Ms. Monica in TWO high risk categories (BRCA 2 mutation with FDR w/ PC AND familial pancreatic cancer kindred.  Additionally, there are recent publications demonstrating elevated risks of Pancreatic Cancer and BRCA2 mutation patients who do not necessarily have a family history of pancreatic cancer (34% of BRCA2 mutation patients with pancreatic cancer did not have a family history of PC).  Based on her significant family history, personal history of a gene mutation known to have increased risk of Pancreatic Cancer, and heightened patient concerns, she elected to proceed with Pancreatic Cancer screening protocol, and I strongly agree with her decision.  We will proceed as below:  -With regards to timing to initiate screening protocol, given mother's potential natural history to develop Pancreatic Cancer is unknown, I recommended starting PC screening now, and she agrees  Screening techniques include the following: -Baseline: MRI/MRCP plus EUS plus fasting blood glucose and/or hemoglobin A1c -Follow-up/surveillance phase: Alternate MRI/MRCP and EUS plus routine  fasting blood glucose and/or hemoglobin A1c -If concerning features on imaging, check CA 19-9 -EUS with FNA for solid lesions ?5 mm, cystic lesions with worrisome features, or asymptomatic main pancreatic duct (MPD) strictures (with or without mass) -CT only for solid lesions, regardless of size, or asymptomatic MPD strictures of unknown etiology (without mass)  -Screening interval every 12 months (or Q6 months per local center of excellence protocol) in patients with no abnormalities/nonconcerning abnormalities -Screening interval every 3-6 months in patients with abnormalities that are NOT suspicious for malignancy, but are concerning -EUS evaluation should be performed within 3-6 months for indeterminate lesions (abnormalities that are NOT suspicious for malignancy, but are concerning) -EUS evaluation should be performed within 3 months for high-risk lesions, if surgical resection is not planned. -Immediate surgical referral for abnormality suspicious for malignancy  -New-onset diabetes in a high-risk individual should lead to additional diagnostic studies or change in surveillance interval  -Genetic testing and counseling should be considered for familial pancreas cancer relatives who are eligible for surveillance. A positive germline mutation is associated with an increased risk of neoplastic progression and may also lead to  screening for other relevant associated cancers -Participation in a registry or referral to a pancreas Center of Excellence should be pursued when possible for high-risk patients undergoing pancreas cancer screening -The target detectable pancreatic neoplasms are resectable stage I pancreatic ductal adenocarcinoma and high-risk precursor neoplasms, such as intraductal papillary mucinous neoplasms with high-grade dysplasia and some enlarged pancreatic intraepithelial neoplasias  -We discussed the limitations and potential risks of pancreas cancer screening prior to  initiating any screening program, and the patient wishes to proceed with screening.  I spent 60 minutes of time, including in depth chart review, independent review of results as outlined above, communicating results with the patient directly, face-to-face time with the patient, coordinating care, ordering studies and medications as appropriate, and documentation.     Lavena Bullion, DO, FACG  02/14/2020, 3:02 PM   Rolm Bookbinder, MD

## 2020-02-23 ENCOUNTER — Other Ambulatory Visit: Payer: Self-pay | Admitting: Gastroenterology

## 2020-02-23 DIAGNOSIS — Z803 Family history of malignant neoplasm of breast: Secondary | ICD-10-CM

## 2020-02-23 DIAGNOSIS — Z800 Family history of malignant neoplasm of digestive organs: Secondary | ICD-10-CM

## 2020-02-24 ENCOUNTER — Ambulatory Visit (HOSPITAL_COMMUNITY)
Admission: RE | Admit: 2020-02-24 | Discharge: 2020-02-24 | Disposition: A | Discharge status: Home / Self Care | Payer: No Typology Code available for payment source | Source: Ambulatory Visit | Attending: Gastroenterology | Admitting: Gastroenterology

## 2020-02-24 ENCOUNTER — Other Ambulatory Visit: Payer: Self-pay

## 2020-02-24 ENCOUNTER — Other Ambulatory Visit (INDEPENDENT_AMBULATORY_CARE_PROVIDER_SITE_OTHER): Payer: No Typology Code available for payment source

## 2020-02-24 DIAGNOSIS — Z803 Family history of malignant neoplasm of breast: Secondary | ICD-10-CM

## 2020-02-24 DIAGNOSIS — Z800 Family history of malignant neoplasm of digestive organs: Secondary | ICD-10-CM

## 2020-02-24 DIAGNOSIS — K760 Fatty (change of) liver, not elsewhere classified: Secondary | ICD-10-CM | POA: Insufficient documentation

## 2020-02-24 DIAGNOSIS — Z1289 Encounter for screening for malignant neoplasm of other sites: Secondary | ICD-10-CM | POA: Insufficient documentation

## 2020-02-24 LAB — BASIC METABOLIC PANEL
BUN: 16 mg/dL (ref 6–23)
CO2: 27 mEq/L (ref 19–32)
Calcium: 8.9 mg/dL (ref 8.4–10.5)
Chloride: 108 mEq/L (ref 96–112)
Creatinine, Ser: 0.67 mg/dL (ref 0.40–1.20)
GFR: 121.27 mL/min (ref >60.00)
Glucose, Bld: 102 mg/dL — ABNORMAL HIGH (ref 70–99)
Potassium: 4.2 mEq/L (ref 3.5–5.1)
Sodium: 141 mEq/L (ref 135–145)

## 2020-02-24 LAB — HEMOGLOBIN A1C: Hgb A1c MFr Bld: 5.5 % (ref 4.6–6.5)

## 2020-02-24 MED ORDER — GADOBUTROL 1 MMOL/ML IV SOLN
10.0000 mL | Freq: Once | INTRAVENOUS | Status: AC | PRN
  Administered 2020-02-24: 10 mL via INTRAVENOUS

## 2020-02-25 ENCOUNTER — Telehealth: Payer: Self-pay | Admitting: Gastroenterology

## 2020-02-25 NOTE — Telephone Encounter (Signed)
Spoke to patient to inform her of CT and lab results. She understands that Dr Bryan Lemma has not reviewed that at this time. All questions answered. Patient voiced understanding

## 2020-03-05 NOTE — Progress Notes (Signed)
Patient Care Team: Patient, No Pcp Per as PCP - General (General Practice) Rolm Bookbinder, MD as Consulting Physician (General Surgery)  DIAGNOSIS:    ICD-10-CM   1. 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 with above-mentioned history of high risk for breast cancer, and positive for BRCA2 gene mutation. She presents to the clinic today for follow-up.  She is currently on tamoxifen therapy and appears to be having some side effects from the treatment.  She feels numbness in her hands and feet intermittently.  Also has profound hot flashes. She is scheduled to undergo the surgery on 03/18/2020 with Dr. Donne Hazel. The lymph nodes in axilla are felt to be related to COVID-19 vaccine.  ALLERGIES:  has No Known Allergies.  MEDICATIONS:  Current Outpatient Medications  Medication Sig Dispense Refill  . Multiple Vitamins-Minerals (MULTIVITAMIN GUMMIES ADULT PO) Take 2 tablets by mouth daily.    . tamoxifen (NOLVADEX) 20 MG tablet Take 0.5 tablets (10 mg total) by mouth daily. 90 tablet 3   No current facility-administered medications for this visit.    PHYSICAL EXAMINATION: ECOG PERFORMANCE STATUS: 1 - Symptomatic but completely ambulatory  Vitals:   03/06/20 0823  BP: (!) 113/45  Pulse: (!) 51  Resp: 18  Temp: 97.9 F (36.6 C)  SpO2: 99%   Filed Weights   03/06/20 0823  Weight: 231 lb (104.8 kg)    BREAST: No palpable masses or nodules in either right or left breasts. No palpable axillary supraclavicular or infraclavicular adenopathy no breast tenderness or nipple discharge. (exam performed in the presence of a chaperone)  LABORATORY DATA:  I have reviewed the data as listed CMP Latest Ref Rng & Units 02/24/2020 12/06/2015 01/21/2015  Glucose 70 - 99 mg/dL 102(H) 100(H) 92  BUN 6 - 23 mg/dL  _0 Creatinine 0.40 - 1.20 mg/dL 0.67 0.57 0.68  Sodium 135 - 145 mEq/L 141 135 139  Potassium 3.5 - 5.1 mEq/L 4.2 3.7 3.8  Chloride 96 - 112 mEq/L 108 105 106  CO2 19 - 32 mEq/L _1 Calcium 8.4 - 10.5 mg/dL 8.9 8.8(L) 9.3  Total Protein 6.5 - 8.1 g/dL - - 7.8  Total Bilirubin 0.3 - 1.2 mg/dL - - 0.4  Alkaline Phos 38 - 126 U/L - - 46  AST 15 - 41 U/L - - 15  ALT 14 - 54 U/L - - 15    Lab Results  Component Value Date   WBC 4.6 12/06/2015   HGB 11.0 (L) 12/06/2015   HCT 33.4 (L) 12/06/2015   MCV 81.7 12/06/2015   PLT 227 12/06/2015   NEUTROABS 2.2 12/06/2015    ASSESSMENT & PLAN:  BRCA2 gene mutation positive in female Cancer risk:  Average to risk of cancer by age of 42: (Antoniou et al pooled pedigree data from 34 studies of 8139 index patients with breast or ovarian cancer) Breast cancer risk: 45 percent (95% CI, 33 to 54 percent) Ovarian cancer risk: 11 percent (95% CI, 4.1 to 18 percent)  Left lumpectomy: 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 She will also likely need evaluation for the axillary lymph node.  Surveillance: 1. Breast MRI  11/06/2019: 4.4 cm indeterminate non-mass enhancement, multiple enlarged level 1 and level 2 left axillary lymph nodes surgical changes left breast consistent with intraductal papilloma. 2. left axillary ultrasound 01/15/2020: Decrease in thickness of the 2 left axillary lymph nodes 3. MRI abdomen: MRCP: No evidence of pancreatic mass mild hepatic steatosis  Plan: Patient scheduled to undergo breast conserving surgery on 03/17/2020. Risk reduction: Tamoxifen x5 years Tamoxifen toxicities: 1.  Numbness in the hands intermittently 2. severe hot flashes We decrease the dosage of tamoxifen to 10 mg daily today.  Return to clinic based upon the pathology report. I ordered an MRI of the breast to be done in  June 2022.  Low blood pressure: I will refer her to Dr. Haroldine Laws with cardiology. Return to clinic in 1 year for follow-up    Orders Placed This Encounter  Procedures  . MR BREAST BILATERAL W WO CONTRAST INC CAD    Standing Status:   Future    Standing Expiration Date:   03/06/2021    Order Specific Question:   If indicated for the ordered procedure, I authorize the administration of contrast media per Radiology protocol    Answer:   Yes    Order Specific Question:   What is the patient's sedation requirement?    Answer:   No Sedation    Order Specific Question:   Does the patient have a pacemaker or implanted devices?    Answer:   No    Order Specific Question:   Preferred imaging location?    Answer:   [facility] (table limit-550lbs)    Order Specific Question:   Release to patient    Answer:   Immediate   The patient has a good understanding of the overall plan. she agrees with it. she will call with any problems that may develop before the next visit here.  Total time spent: 30 mins including face to face time and time spent for planning, charting and coordination of care  Nicholas Lose, MD 03/06/2020  I, Cloyde Reams Dorshimer, am acting as scribe for Dr. Nicholas Lose.  I have reviewed the above documentation for accuracy and completeness, and I agree with the above.

## 2020-03-06 ENCOUNTER — Other Ambulatory Visit: Payer: Self-pay

## 2020-03-06 ENCOUNTER — Inpatient Hospital Stay
Payer: No Typology Code available for payment source | Attending: Licensed Clinical Social Worker | Admitting: Hematology and Oncology

## 2020-03-06 VITALS — BP 113/45 | HR 51 | Temp 97.9°F | Resp 18 | Ht 60.0 in | Wt 231.0 lb

## 2020-03-06 DIAGNOSIS — Z1501 Genetic susceptibility to malignant neoplasm of breast: Secondary | ICD-10-CM | POA: Insufficient documentation

## 2020-03-06 DIAGNOSIS — I959 Hypotension, unspecified: Secondary | ICD-10-CM

## 2020-03-06 DIAGNOSIS — Z1502 Genetic susceptibility to malignant neoplasm of ovary: Secondary | ICD-10-CM | POA: Diagnosis not present

## 2020-03-06 DIAGNOSIS — Z1509 Genetic susceptibility to other malignant neoplasm: Secondary | ICD-10-CM | POA: Insufficient documentation

## 2020-03-06 DIAGNOSIS — Z79810 Long term (current) use of selective estrogen receptor modulators (SERMs): Secondary | ICD-10-CM | POA: Diagnosis not present

## 2020-03-06 DIAGNOSIS — Z148 Genetic carrier of other disease: Secondary | ICD-10-CM | POA: Insufficient documentation

## 2020-03-06 MED ORDER — TAMOXIFEN CITRATE 20 MG PO TABS: 10.0000 mg | ORAL_TABLET | Freq: Every day | ORAL | 3 refills | Status: DC

## 2020-03-06 NOTE — Assessment & Plan Note (Signed)
Cancer risk:  Average to risk of cancer by age of 41: (Antoniou et al pooled pedigree data from 63 studies of 8139 index patients with breast or ovarian cancer) Breast cancer risk: 45 percent (95% CI, 33 to 54 percent) Ovarian cancer risk: 11 percent (95% CI, 4.1 to 18 percent)  Left lumpectomy: 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 She will also likely need evaluation for the axillary lymph node.  Surveillance: 1. Breast MRI 11/06/2019: 4.4 cm indeterminate non-mass enhancement, multiple enlarged level 1 and level 2 left axillary lymph nodes surgical changes left breast consistent with intraductal papilloma. 2. left axillary ultrasound 01/15/2020: Decrease in thickness of the 2 left axillary lymph nodes 3. MRI abdomen: MRCP: No evidence of pancreatic mass mild hepatic steatosis  Plan: Patient scheduled to undergo breast conserving surgery on 03/17/2020. Return to clinic based upon the pathology report.

## 2020-03-11 ENCOUNTER — Other Ambulatory Visit: Payer: Self-pay

## 2020-03-11 ENCOUNTER — Encounter (HOSPITAL_BASED_OUTPATIENT_CLINIC_OR_DEPARTMENT_OTHER): Payer: Self-pay | Admitting: General Surgery

## 2020-03-13 ENCOUNTER — Other Ambulatory Visit: Payer: No Typology Code available for payment source

## 2020-03-14 ENCOUNTER — Other Ambulatory Visit (HOSPITAL_COMMUNITY)
Admission: RE | Admit: 2020-03-14 | Discharge: 2020-03-14 | Disposition: A | Discharge status: Home / Self Care | Payer: No Typology Code available for payment source | Source: Ambulatory Visit | Attending: General Surgery | Admitting: General Surgery

## 2020-03-14 DIAGNOSIS — Z20822 Contact with and (suspected) exposure to covid-19: Secondary | ICD-10-CM | POA: Diagnosis not present

## 2020-03-14 DIAGNOSIS — Z01818 Encounter for other preprocedural examination: Secondary | ICD-10-CM | POA: Diagnosis present

## 2020-03-15 LAB — SARS CORONAVIRUS 2 (TAT 6-24 HRS): SARS Coronavirus 2: NEGATIVE

## 2020-03-17 ENCOUNTER — Other Ambulatory Visit: Payer: Self-pay

## 2020-03-17 ENCOUNTER — Ambulatory Visit
Admission: RE | Admit: 2020-03-17 | Discharge: 2020-03-17 | Disposition: A | Discharge status: Home / Self Care | Payer: No Typology Code available for payment source | Source: Ambulatory Visit | Attending: General Surgery | Admitting: General Surgery

## 2020-03-17 DIAGNOSIS — N6489 Other specified disorders of breast: Secondary | ICD-10-CM

## 2020-03-17 MED ORDER — ENSURE PRE-SURGERY PO LIQD: 296.0000 mL | Freq: Once | ORAL | Status: DC

## 2020-03-17 NOTE — Progress Notes (Signed)
Anesthesia consult per Dr. Linna Caprice, will proceed with surgery as scheduled.

## 2020-03-17 NOTE — Progress Notes (Signed)

## 2020-03-18 ENCOUNTER — Encounter (HOSPITAL_BASED_OUTPATIENT_CLINIC_OR_DEPARTMENT_OTHER)
Admission: RE | Disposition: A | Discharge status: Home / Self Care | Payer: Self-pay | Source: Home / Self Care | Attending: General Surgery

## 2020-03-18 ENCOUNTER — Ambulatory Visit
Admission: RE | Admit: 2020-03-18 | Discharge: 2020-03-18 | Disposition: A | Discharge status: Home / Self Care | Payer: No Typology Code available for payment source | Source: Ambulatory Visit | Attending: General Surgery | Admitting: General Surgery

## 2020-03-18 ENCOUNTER — Other Ambulatory Visit: Payer: Self-pay

## 2020-03-18 ENCOUNTER — Encounter (HOSPITAL_BASED_OUTPATIENT_CLINIC_OR_DEPARTMENT_OTHER): Payer: Self-pay | Admitting: General Surgery

## 2020-03-18 ENCOUNTER — Ambulatory Visit (HOSPITAL_BASED_OUTPATIENT_CLINIC_OR_DEPARTMENT_OTHER): Payer: No Typology Code available for payment source | Admitting: Anesthesiology

## 2020-03-18 ENCOUNTER — Ambulatory Visit (HOSPITAL_BASED_OUTPATIENT_CLINIC_OR_DEPARTMENT_OTHER)
Admission: RE | Admit: 2020-03-18 | Discharge: 2020-03-18 | Disposition: A | Discharge status: Home / Self Care | Payer: No Typology Code available for payment source | Attending: General Surgery | Admitting: General Surgery

## 2020-03-18 DIAGNOSIS — Z1501 Genetic susceptibility to malignant neoplasm of breast: Secondary | ICD-10-CM | POA: Insufficient documentation

## 2020-03-18 DIAGNOSIS — N6489 Other specified disorders of breast: Secondary | ICD-10-CM | POA: Diagnosis not present

## 2020-03-18 DIAGNOSIS — N6310 Unspecified lump in the right breast, unspecified quadrant: Secondary | ICD-10-CM | POA: Diagnosis present

## 2020-03-18 DIAGNOSIS — Z6841 Body Mass Index (BMI) 40.0 and over, adult: Secondary | ICD-10-CM | POA: Diagnosis not present

## 2020-03-18 DIAGNOSIS — D242 Benign neoplasm of left breast: Secondary | ICD-10-CM | POA: Diagnosis not present

## 2020-03-18 DIAGNOSIS — Z803 Family history of malignant neoplasm of breast: Secondary | ICD-10-CM | POA: Diagnosis not present

## 2020-03-18 DIAGNOSIS — D241 Benign neoplasm of right breast: Secondary | ICD-10-CM | POA: Diagnosis not present

## 2020-03-18 DIAGNOSIS — N6021 Fibroadenosis of right breast: Secondary | ICD-10-CM | POA: Insufficient documentation

## 2020-03-18 HISTORY — PX: RADIOACTIVE SEED GUIDED EXCISIONAL BREAST BIOPSY: SHX6490

## 2020-03-18 SURGERY — RADIOACTIVE SEED GUIDED BREAST BIOPSY
Anesthesia: General | Site: Breast | Laterality: Right

## 2020-03-18 MED ORDER — FENTANYL CITRATE (PF) 100 MCG/2ML IJ SOLN
INTRAMUSCULAR | Status: AC
  Filled 2020-03-18: qty 2

## 2020-03-18 MED ORDER — ONDANSETRON HCL 4 MG/2ML IJ SOLN
INTRAMUSCULAR | Status: AC
  Filled 2020-03-18: qty 2

## 2020-03-18 MED ORDER — DEXAMETHASONE SODIUM PHOSPHATE 4 MG/ML IJ SOLN
INTRAMUSCULAR | Status: DC | PRN
  Administered 2020-03-18: 10 mg via INTRAVENOUS

## 2020-03-18 MED ORDER — OXYCODONE HCL 5 MG PO TABS
5.0000 mg | ORAL_TABLET | Freq: Once | ORAL | Status: AC
  Administered 2020-03-18: 5 mg via ORAL

## 2020-03-18 MED ORDER — FENTANYL CITRATE (PF) 100 MCG/2ML IJ SOLN: 25.0000 ug | INTRAMUSCULAR | Status: DC | PRN

## 2020-03-18 MED ORDER — EPHEDRINE 5 MG/ML INJ
INTRAVENOUS | Status: AC
  Filled 2020-03-18: qty 10

## 2020-03-18 MED ORDER — SUCCINYLCHOLINE CHLORIDE 200 MG/10ML IV SOSY
PREFILLED_SYRINGE | INTRAVENOUS | Status: AC
  Filled 2020-03-18: qty 10

## 2020-03-18 MED ORDER — LIDOCAINE HCL (CARDIAC) PF 100 MG/5ML IV SOSY
PREFILLED_SYRINGE | INTRAVENOUS | Status: DC | PRN
  Administered 2020-03-18: 100 mg via INTRAVENOUS

## 2020-03-18 MED ORDER — GABAPENTIN 100 MG PO CAPS
100.0000 mg | ORAL_CAPSULE | ORAL | Status: AC
  Administered 2020-03-18: 100 mg via ORAL

## 2020-03-18 MED ORDER — KETOROLAC TROMETHAMINE 15 MG/ML IJ SOLN
15.0000 mg | INTRAMUSCULAR | Status: AC
  Administered 2020-03-18: 15 mg via INTRAVENOUS

## 2020-03-18 MED ORDER — FENTANYL CITRATE (PF) 100 MCG/2ML IJ SOLN
INTRAMUSCULAR | Status: DC | PRN
Start: 2020-03-18 — End: 2020-03-18
  Administered 2020-03-18: 100 ug via INTRAVENOUS

## 2020-03-18 MED ORDER — ONDANSETRON HCL 4 MG/2ML IJ SOLN
INTRAMUSCULAR | Status: DC | PRN
  Administered 2020-03-18: 4 mg via INTRAVENOUS

## 2020-03-18 MED ORDER — CEFAZOLIN SODIUM-DEXTROSE 2-4 GM/100ML-% IV SOLN
2.0000 g | INTRAVENOUS | Status: AC
  Administered 2020-03-18: 2 g via INTRAVENOUS

## 2020-03-18 MED ORDER — BUPIVACAINE HCL (PF) 0.25 % IJ SOLN
INTRAMUSCULAR | Status: DC | PRN
  Administered 2020-03-18: 10 mL

## 2020-03-18 MED ORDER — GABAPENTIN 100 MG PO CAPS
ORAL_CAPSULE | ORAL | Status: AC
  Filled 2020-03-18: qty 1

## 2020-03-18 MED ORDER — MIDAZOLAM HCL 2 MG/2ML IJ SOLN
INTRAMUSCULAR | Status: AC
  Filled 2020-03-18: qty 2

## 2020-03-18 MED ORDER — PROPOFOL 10 MG/ML IV BOLUS
INTRAVENOUS | Status: DC | PRN
  Administered 2020-03-18: 200 mg via INTRAVENOUS

## 2020-03-18 MED ORDER — PROMETHAZINE HCL 25 MG/ML IJ SOLN: 6.2500 mg | INTRAMUSCULAR | Status: DC | PRN

## 2020-03-18 MED ORDER — OXYCODONE HCL 5 MG PO TABS
ORAL_TABLET | ORAL | Status: AC
  Filled 2020-03-18: qty 1

## 2020-03-18 MED ORDER — PHENYLEPHRINE 40 MCG/ML (10ML) SYRINGE FOR IV PUSH (FOR BLOOD PRESSURE SUPPORT)
PREFILLED_SYRINGE | INTRAVENOUS | Status: AC
  Filled 2020-03-18: qty 10

## 2020-03-18 MED ORDER — MIDAZOLAM HCL 5 MG/5ML IJ SOLN
INTRAMUSCULAR | Status: DC | PRN
  Administered 2020-03-18: 2 mg via INTRAVENOUS

## 2020-03-18 MED ORDER — KETOROLAC TROMETHAMINE 15 MG/ML IJ SOLN
INTRAMUSCULAR | Status: AC
  Filled 2020-03-18: qty 1

## 2020-03-18 MED ORDER — CEFAZOLIN SODIUM-DEXTROSE 2-4 GM/100ML-% IV SOLN
INTRAVENOUS | Status: AC
  Filled 2020-03-18: qty 100

## 2020-03-18 MED ORDER — ACETAMINOPHEN 500 MG PO TABS
1000.0000 mg | ORAL_TABLET | ORAL | Status: AC
  Administered 2020-03-18: 1000 mg via ORAL

## 2020-03-18 MED ORDER — LIDOCAINE 2% (20 MG/ML) 5 ML SYRINGE
INTRAMUSCULAR | Status: AC
  Filled 2020-03-18: qty 5

## 2020-03-18 MED ORDER — DEXAMETHASONE SODIUM PHOSPHATE 10 MG/ML IJ SOLN
INTRAMUSCULAR | Status: AC
  Filled 2020-03-18: qty 1

## 2020-03-18 MED ORDER — ACETAMINOPHEN 500 MG PO TABS
ORAL_TABLET | ORAL | Status: AC
  Filled 2020-03-18: qty 2

## 2020-03-18 MED ORDER — LACTATED RINGERS IV SOLN: INTRAVENOUS | Status: DC

## 2020-03-18 SURGICAL SUPPLY — 59 items
ADH SKN CLS APL DERMABOND .7 (GAUZE/BANDAGES/DRESSINGS) ×1
APL PRP STRL LF DISP 70% ISPRP (MISCELLANEOUS) ×1
APPLIER CLIP 9.375 MED OPEN (MISCELLANEOUS)
APR CLP MED 9.3 20 MLT OPN (MISCELLANEOUS)
BINDER BREAST 3XL (GAUZE/BANDAGES/DRESSINGS) ×3 IMPLANT
BINDER BREAST LRG (GAUZE/BANDAGES/DRESSINGS) IMPLANT
BINDER BREAST MEDIUM (GAUZE/BANDAGES/DRESSINGS) IMPLANT
BINDER BREAST XLRG (GAUZE/BANDAGES/DRESSINGS) IMPLANT
BINDER BREAST XXLRG (GAUZE/BANDAGES/DRESSINGS) IMPLANT
BLADE SURG 15 STRL LF DISP TIS (BLADE) ×1 IMPLANT
BLADE SURG 15 STRL SS (BLADE) ×3
CANISTER SUC SOCK COL 7IN (MISCELLANEOUS) IMPLANT
CANISTER SUCT 1200ML W/VALVE (MISCELLANEOUS) IMPLANT
CHLORAPREP W/TINT 26 (MISCELLANEOUS) ×3 IMPLANT
CLIP APPLIE 9.375 MED OPEN (MISCELLANEOUS) IMPLANT
CLIP VESOCCLUDE SM WIDE 6/CT (CLIP) IMPLANT
CLOSURE WOUND 1/2 X4 (GAUZE/BANDAGES/DRESSINGS) ×1
COVER BACK TABLE 60X90IN (DRAPES) ×3 IMPLANT
COVER MAYO STAND STRL (DRAPES) ×3 IMPLANT
COVER PROBE W GEL 5X96 (DRAPES) ×3 IMPLANT
COVER WAND RF STERILE (DRAPES) IMPLANT
DECANTER SPIKE VIAL GLASS SM (MISCELLANEOUS) IMPLANT
DERMABOND ADVANCED (GAUZE/BANDAGES/DRESSINGS) ×2
DERMABOND ADVANCED .7 DNX12 (GAUZE/BANDAGES/DRESSINGS) ×1 IMPLANT
DRAPE LAPAROSCOPIC ABDOMINAL (DRAPES) ×3 IMPLANT
DRAPE UTILITY XL STRL (DRAPES) ×3 IMPLANT
DRSG TEGADERM 4X4.75 (GAUZE/BANDAGES/DRESSINGS) IMPLANT
ELECT COATED BLADE 2.86 ST (ELECTRODE) ×3 IMPLANT
ELECT REM PT RETURN 9FT ADLT (ELECTROSURGICAL) ×3
ELECTRODE REM PT RTRN 9FT ADLT (ELECTROSURGICAL) ×1 IMPLANT
GAUZE SPONGE 4X4 12PLY STRL LF (GAUZE/BANDAGES/DRESSINGS) IMPLANT
GLOVE BIO SURGEON STRL SZ7 (GLOVE) ×6 IMPLANT
GLOVE BIOGEL PI IND STRL 7.5 (GLOVE) ×2 IMPLANT
GLOVE BIOGEL PI INDICATOR 7.5 (GLOVE) ×4
GOWN STRL REUS W/ TWL LRG LVL3 (GOWN DISPOSABLE) ×2 IMPLANT
GOWN STRL REUS W/TWL LRG LVL3 (GOWN DISPOSABLE) ×6
HEMOSTAT ARISTA ABSORB 3G PWDR (HEMOSTASIS) IMPLANT
KIT MARKER MARGIN INK (KITS) ×3 IMPLANT
NEEDLE HYPO 25X1 1.5 SAFETY (NEEDLE) ×3 IMPLANT
NS IRRIG 1000ML POUR BTL (IV SOLUTION) ×3 IMPLANT
PACK BASIN DAY SURGERY FS (CUSTOM PROCEDURE TRAY) ×3 IMPLANT
PENCIL SMOKE EVACUATOR (MISCELLANEOUS) ×3 IMPLANT
RETRACTOR ONETRAX LX 90X20 (MISCELLANEOUS) IMPLANT
SLEEVE SCD COMPRESS KNEE MED (MISCELLANEOUS) ×3 IMPLANT
SPONGE LAP 4X18 RFD (DISPOSABLE) ×3 IMPLANT
STRIP CLOSURE SKIN 1/2X4 (GAUZE/BANDAGES/DRESSINGS) ×2 IMPLANT
SUT MNCRL AB 4-0 PS2 18 (SUTURE) ×3 IMPLANT
SUT MON AB 5-0 PS2 18 (SUTURE) ×3 IMPLANT
SUT SILK 2 0 SH (SUTURE) IMPLANT
SUT VIC AB 2-0 SH 27 (SUTURE) ×3
SUT VIC AB 2-0 SH 27XBRD (SUTURE) ×1 IMPLANT
SUT VIC AB 3-0 SH 27 (SUTURE) ×3
SUT VIC AB 3-0 SH 27X BRD (SUTURE) ×1 IMPLANT
SYR CONTROL 10ML LL (SYRINGE) ×3 IMPLANT
TOWEL GREEN STERILE FF (TOWEL DISPOSABLE) ×3 IMPLANT
TRAY FAXITRON CT DISP (TRAY / TRAY PROCEDURE) ×3 IMPLANT
TUBE CONNECTING 20'X1/4 (TUBING)
TUBE CONNECTING 20X1/4 (TUBING) IMPLANT
YANKAUER SUCT BULB TIP NO VENT (SUCTIONS) IMPLANT

## 2020-03-18 NOTE — Discharge Instructions (Signed)
[facility] Office Phone Number [phone]  BREAST BIOPSY/ PARTIAL MASTECTOMY: POST OP INSTRUCTIONS Take 400 mg of ibuprofen every 8 hours or 650 mg tylenol every 6 hours for next 72 hours then as needed. Use ice several times daily also. Always review your discharge instruction sheet given to you by the facility where your surgery was performed.  IF YOU HAVE DISABILITY OR FAMILY LEAVE FORMS, YOU MUST BRING THEM TO THE OFFICE FOR PROCESSING.  DO NOT GIVE THEM TO YOUR DOCTOR.  1. A prescription for pain medication may be given to you upon discharge.  Take your pain medication as prescribed, if needed.  If narcotic pain medicine is not needed, then you may take acetaminophen (Tylenol), naprosyn (Alleve) or ibuprofen (Advil) as needed. 2. Take your usually prescribed medications unless otherwise directed 3. If you need a refill on your pain medication, please contact your pharmacy.  They will contact our office to request authorization.  Prescriptions will not be filled after 5pm or on week-ends. 4. You should eat very light the first 24 hours after surgery, such as soup, crackers, pudding, etc.  Resume your normal diet the day after surgery. 5. Most patients will experience some swelling and bruising in the breast.  Ice packs and a good support bra will help.  Wear the breast binder provided or a sports bra for 72 hours day and night.  After that wear a sports bra during the day until you return to the office. Swelling and bruising can take several days to resolve.  6. It is common to experience some constipation if taking pain medication after surgery.  Increasing fluid intake and taking a stool softener will usually help or prevent this problem from occurring.  A mild laxative (Milk of Magnesia or Miralax) should be taken according to package directions if there are no bowel movements after 48 hours. 7. Unless discharge instructions indicate otherwise, you may remove your bandages 48  hours after surgery and you may shower at that time.  You may have steri-strips (small skin tapes) in place directly over the incision.  These strips should be left on the skin for 7-10 days and will come off on their own.  If your surgeon used skin glue on the incision, you may shower in 24 hours.  The glue will flake off over the next 2-3 weeks.  Any sutures or staples will be removed at the office during your follow-up visit. 8. ACTIVITIES:  You may resume regular daily activities (gradually increasing) beginning the next day.  Wearing a good support bra or sports bra minimizes pain and swelling.  You may have sexual intercourse when it is comfortable. a. You may drive when you no longer are taking prescription pain medication, you can comfortably wear a seatbelt, and you can safely maneuver your car and apply brakes. b. RETURN TO WORK:  ______________________________________________________________________________________ 9. You should see your doctor in the office for a follow-up appointment approximately two weeks after your surgery.  Your doctor's nurse will typically make your follow-up appointment when she calls you with your pathology report.  Expect your pathology report 3-4 business days after your surgery.  You may call to check if you do not hear from us after three days. 10. OTHER INSTRUCTIONS: _______________________________________________________________________________________________ _____________________________________________________________________________________________________________________________________ _____________________________________________________________________________________________________________________________________ _____________________________________________________________________________________________________________________________________  WHEN TO CALL DR WAKEFIELD: 1. Fever over 101.0 2. Nausea and/or vomiting. 3. Extreme swelling or  bruising. 4. Continued bleeding from incision. 5. Increased pain, redness, or drainage from the incision.  The clinic   staff is available to answer your questions during regular business hours.  Please don't hesitate to call and ask to speak to one of the nurses for clinical concerns.  If you have a medical emergency, go to the nearest emergency room or call 911.  A surgeon from [facility] is always on call at the hospital.  For further questions, please visit [email or web address]    Post Anesthesia Home Care Instructions  Activity: Get plenty of rest for the remainder of the day. A responsible individual must stay with you for 24 hours following the procedure.  For the next 24 hours, DO NOT: -Drive a car -Paediatric nurse -Drink alcoholic beverages -Take any medication unless instructed by your physician -Make any legal decisions or sign important papers.  Meals: Start with liquid foods such as gelatin or soup. Progress to regular foods as tolerated. Avoid greasy, spicy, heavy foods. If nausea and/or vomiting occur, drink only clear liquids until the nausea and/or vomiting subsides. Call your physician if vomiting continues.  Special Instructions/Symptoms: Your throat may feel dry or sore from the anesthesia or the breathing tube placed in your throat during surgery. If this causes discomfort, gargle with warm salt water. The discomfort should disappear within 24 hours.  If you had a scopolamine patch placed behind your ear for the management of post- operative nausea and/or vomiting:  1. The medication in the patch is effective for 72 hours, after which it should be removed.  Wrap patch in a tissue and discard in the trash. Wash hands thoroughly with soap and water. 2. You may remove the patch earlier than 72 hours if you experience unpleasant side effects which may include dry mouth, dizziness or visual disturbances. 3. Avoid touching the patch. Wash your  hands with soap and water after contact with the patch.       Call your surgeon if you experience:   1.  Fever over 101.0. 2.  Inability to urinate. 3.  Nausea and/or vomiting. 4.  Extreme swelling or bruising at the surgical site. 5.  Continued bleeding from the incision. 6.  Increased pain, redness or drainage from the incision. 7.  Problems related to your pain medication. 8.  Any problems and/or concerns

## 2020-03-18 NOTE — Transfer of Care (Signed)
Immediate Anesthesia Transfer of Care Note  Patient: April Valdez  Procedure(s) Performed: RIGHT RADIOACTIVE SEED GUIDED EXCISIONAL BREAST BIOPSY (Right Breast)  Patient Location: PACU  Anesthesia Type:General  Level of Consciousness: awake, drowsy and patient cooperative  Airway & Oxygen Therapy: Patient Spontanous Breathing and Patient connected to nasal cannula oxygen  Post-op Assessment: Report given to RN and Post -op Vital signs reviewed and stable  Post vital signs: Reviewed and stable  Last Vitals:  Vitals Value Taken Time  BP 111/58 03/18/20 1548  Temp    Pulse 56 03/18/20 1550  Resp 18 03/18/20 1550  SpO2 100 % 03/18/20 1550  Vitals shown include unvalidated device data.  Last Pain:  Vitals:   03/18/20 1312  TempSrc: Oral  PainSc: 0-No pain         Complications: No complications documented.

## 2020-03-18 NOTE — Op Note (Signed)
Preoperative diagnosis:right breast mass, brca 2 positive Postoperative diagnosis: Same as above Procedure: Right  breast radioactive seed guided excisional biopsy Surgeon: Dr. Serita Grammes Anesthesia: General Estimated blood loss: Minimal Specimens: Right breast tissue marked with paint Complications: None Drains: None Sponge and needle count was correct at completion Disposition to recovery in stable condition  Indications: 33 yof I know from benign left breast papilloma excision who was found to have brca 2 mutation on genetics. she then had mri (after covid vaccine in left arm) that showed a 4.4 cm area of nme in right breast with multiple asymmetrically enlarged ax nodes. biopsy of the nme is csl. she is due for repeat US on 18th for left axilla. she has seen oncology to discuss options for brca 2 diagnosis. Does not want to do more surgery at this point. we discussed staged reduction/lift followed by nsm as possibility. we did discuss excision of csl given her brca diagnosis.    Procedure: After informed consent was obtained the patient was taken to the operating room.  She had a seed placed previously.  She was given antibiotics.  SCDs were in place.  She was placed under general anesthesia without complication.  She was prepped and draped in the standard sterile surgical fashion.  A surgical timeout was then performed.   I infiltrated Marcaine in the upper outer quadrant where the seed  Was and then made a periareolar incision to hide the scar later.   I then removed the tissue and the radioactive seed that was in it.  I confirmed removal of the seed and clip with a mammogram in the OR. This was then marked with paint and passed off the table.  I then obtained hemostasis.  I closed the breast tissue with 2-0 Vicryl.  The dermis was closed with 3-0 Vicryl and 5-0 Monocryl.  Glue and Steri-Strips were applied.  She tolerated this well was extubated transferred to recovery stable.

## 2020-03-18 NOTE — H&P (Signed)
April Valdez is an 34 y.o. female.   Chief Complaint: right  breast mass HPI:  31 yof I know from benign left breast papilloma excision who was found to have brca 2 mutation on genetics. she then had mri (after covid vaccine in left arm) that showed a 4.4 cm area of nme in right breast with multiple asymmetrically enlarged ax nodes. biopsy of the nme is csl. she is due for repeat US on 18th for left axilla. she has seen oncology to discuss options for brca 2 diagnosis. Does not want to do more surgery at this point. we discussed staged reduction/lift followed by nsm as possibility.    Past Medical History:  Diagnosis Date  . Family history of breast cancer   . Family history of lung cancer   . Family history of pancreatic cancer   . Family history of prostate cancer   . Family history of stomach cancer   . Headache, migraine   . History of PCOS     Past Surgical History:  Procedure Laterality Date  . APPENDECTOMY    . CESAREAN SECTION     x2  . RADIOACTIVE SEED GUIDED EXCISIONAL BREAST BIOPSY Left 09/19/2019   Procedure: LEFT BREAST RADIOACTIVE SEED GUIDED EXCISIONAL BREAST BIOPSY;  Surgeon: Rolm Bookbinder, MD;  Location: [place];  Service: General;  Laterality: Left;  LMA  . TUBAL LIGATION      Family History  Problem Relation Age of Onset  . Breast cancer Mother 38  . Stroke Maternal Uncle   . Hypertension Maternal Grandfather   . Diabetes Maternal Grandfather   . Prostate cancer Maternal Grandfather   . Hypertension Maternal Uncle   . Pancreatic cancer Maternal Uncle 61  . Hypertension Maternal Aunt   . Anemia Maternal Aunt        low iron  . Diabetes Maternal Aunt   . Pancreatic cancer Maternal Aunt 60  . Lung cancer Maternal Uncle   . Esophageal cancer Neg Hx   . Colon cancer Neg Hx    Social History:  reports that she has never smoked. She has never used smokeless tobacco. She reports that she does not drink alcohol and does not use  drugs.  Allergies: No Known Allergies  No medications prior to admission.    No results found for this or any previous visit (from the past 48 hour(s)). MM RT RADIOACTIVE SEED LOC MAMMO GUIDE  Result Date: 03/17/2020 CLINICAL DATA:  Patient presents for radioactive seed localization of a right breast lesion prior to surgical excision. EXAM: MAMMOGRAPHIC GUIDED RADIOACTIVE SEED LOCALIZATION OF THE RIGHT BREAST COMPARISON:  Previous exam(s). FINDINGS: Patient presents for radioactive seed localization prior to surgical excision. I met with the patient and we discussed the procedure of seed localization including benefits and alternatives. We discussed the high likelihood of a successful procedure. We discussed the risks of the procedure including infection, bleeding, tissue injury and further surgery. We discussed the low dose of radioactivity involved in the procedure. Informed, written consent was given. The usual time-out protocol was performed immediately prior to the procedure. Using mammographic guidance, sterile technique, 1% lidocaine and an I-125 radioactive seed, the barbell shaped biopsy marking clip was localized using a superior approach. The follow-up mammogram images confirm the seed in the expected location and were marked for Dr. Donne Hazel. Follow-up survey of the patient confirms presence of the radioactive seed. Order number of I-125 seed:  [id number]. Total activity:  1.655 millicuries reference Date: 03/03/2020 The  patient tolerated the procedure well and was [facility] from the [facility]. She was given instructions regarding seed removal. IMPRESSION: Radioactive seed localization of the breast. No apparent complications. Electronically Signed   By: Lajean Manes M.D.   On: 03/17/2020 14:06    Review of Systems  All other systems reviewed and are negative.   Height 5' (1.524 m), weight 105.9 kg, last menstrual period 02/28/2020. Physical Exam  General Mental  Status-Alert. Orientation-Oriented X3. Breast Nipples-No Discharge. Breast Lump-No Palpable Breast Mass. Lymphatic Head & Neck General Head & Neck Lymphatics: Bilateral - Description - Normal. Axillary General Axillary Region: Bilateral - Description - Normal. Note: no [place] adenopathy  Assessment/Plan BRCA2 POSITIVE (Z15.01), right breast mass Story: discussed risk reducing surgery, on tamoxifen now and would like to do intensive screening. I discussed right breast seed guided excisional biopsy of nme that is csl although likely benign will plan on this due to her risk level. stop tamoxifen 7 days prior to surgery will schedule after left ax [place] just to make sure resolved as this appears related to covid vaccine.  Rolm Bookbinder, MD 03/18/2020, 11:55 AM

## 2020-03-18 NOTE — Interval H&P Note (Signed)
History and Physical Interval Note:  03/18/2020 2:40 PM  April Valdez  has presented today for surgery, with the diagnosis of RIGHT BREAST MASS.  The various methods of treatment have been discussed with the patient and family. After consideration of risks, benefits and other options for treatment, the patient has consented to  Procedure(s): RIGHT RADIOACTIVE SEED GUIDED EXCISIONAL BREAST BIOPSY (Right) as a surgical intervention.  The patient's history has been reviewed, patient examined, no change in status, stable for surgery.  I have reviewed the patient's chart and labs.  Questions were answered to the patient's satisfaction.     Rolm Bookbinder

## 2020-03-18 NOTE — Anesthesia Preprocedure Evaluation (Addendum)
Anesthesia Evaluation  Patient identified by MRN, date of birth, ID band Patient awake    Reviewed: Allergy & Precautions, NPO status , Patient's Chart, lab work & pertinent test results  History of Anesthesia Complications Negative for: history of anesthetic complications  Airway Mallampati: II  TM Distance: >3 FB Neck ROM: Full    Dental  (+) Teeth Intact   Pulmonary neg pulmonary ROS,    Pulmonary exam normal breath sounds clear to auscultation       Cardiovascular Exercise Tolerance: Good negative cardio ROS Normal cardiovascular exam Rhythm:Regular Rate:Normal     Neuro/Psych  Headaches,    GI/Hepatic negative GI ROS, Neg liver ROS,   Endo/Other  Morbid obesity  Renal/GU negative Renal ROS     Musculoskeletal negative musculoskeletal ROS (+)   Abdominal   Peds  Hematology negative hematology ROS (+)   Anesthesia Other Findings Day of surgery medications reviewed with the patient.  Reproductive/Obstetrics negative OB ROS                            Anesthesia Physical Anesthesia Plan  ASA: III  Anesthesia Plan: General   Post-op Pain Management:    Induction: Intravenous  PONV Risk Score and Plan: 3 and Midazolam, Dexamethasone and Ondansetron  Airway Management Planned: LMA  Additional Equipment:   Intra-op Plan:   Post-operative Plan: Extubation in OR  Informed Consent: I have reviewed the patients History and Physical, chart, labs and discussed the procedure including the risks, benefits and alternatives for the proposed anesthesia with the patient or authorized representative who has indicated his/her understanding and acceptance.       Plan Discussed with: CRNA  Anesthesia Plan Comments:         Anesthesia Quick Evaluation

## 2020-03-18 NOTE — Anesthesia Procedure Notes (Signed)
Procedure Name: LMA Insertion Date/Time: 03/18/2020 3:03 PM Performed by: Willa Frater, CRNA Pre-anesthesia Checklist: Patient identified, Emergency Drugs available, Suction available and Patient being monitored Patient Re-evaluated:Patient Re-evaluated prior to induction Oxygen Delivery Method: Circle system utilized Preoxygenation: Pre-oxygenation with 100% oxygen Induction Type: IV induction Ventilation: Mask ventilation without difficulty LMA: LMA inserted LMA Size: 4.0 Number of attempts: 1 Airway Equipment and Method: Bite block Placement Confirmation: positive ETCO2 Tube secured with: Tape Dental Injury: Teeth and Oropharynx as per pre-operative assessment

## 2020-03-19 ENCOUNTER — Encounter (HOSPITAL_BASED_OUTPATIENT_CLINIC_OR_DEPARTMENT_OTHER): Payer: Self-pay | Admitting: General Surgery

## 2020-03-19 NOTE — Anesthesia Postprocedure Evaluation (Signed)
Anesthesia Post Note  Patient: Dance movement psychotherapist  Procedure(s) Performed: RIGHT RADIOACTIVE SEED GUIDED EXCISIONAL BREAST BIOPSY (Right Breast)     Patient location during evaluation: PACU Anesthesia Type: General Level of consciousness: awake and alert Pain management: pain level controlled Vital Signs Assessment: post-procedure vital signs reviewed and stable Respiratory status: spontaneous breathing, nonlabored ventilation and respiratory function stable Cardiovascular status: blood pressure returned to baseline and stable Postop Assessment: no apparent nausea or vomiting Anesthetic complications: no   No complications documented.  Last Vitals:  Vitals:   03/18/20 1640 03/18/20 1659  BP:  97/66  Pulse:  (!) 55  Resp:  16  Temp:  (!) 36.4 C  SpO2: 100% 100%    Last Pain:  Vitals:   03/18/20 1659  TempSrc: Oral  PainSc: 4                  Catalina Gravel

## 2020-03-24 LAB — SURGICAL PATHOLOGY

## 2020-07-31 ENCOUNTER — Telehealth: Payer: Self-pay

## 2020-07-31 NOTE — Telephone Encounter (Signed)
Patient said she will call back to schedule.

## 2020-07-31 NOTE — Telephone Encounter (Signed)
-----   Message from Lowell Guitar, [place] sent at 02/28/2020  3:11 PM EDT ----- Regarding: EUS Patient needs EUS in six months!

## 2020-07-31 NOTE — Telephone Encounter (Signed)
LVM for patient to call back to schedule an appointment to discuss EUS

## 2020-08-17 ENCOUNTER — Other Ambulatory Visit: Payer: Self-pay | Admitting: Obstetrics and Gynecology

## 2020-11-24 ENCOUNTER — Encounter (HOSPITAL_COMMUNITY): Payer: Self-pay

## 2021-02-24 ENCOUNTER — Telehealth: Payer: Self-pay

## 2021-02-24 NOTE — Telephone Encounter (Signed)
-----   Message from Yevette Edwards, RN sent at 02/24/2021  9:21 AM EDT ----- Regarding: FW: Follow-up high risk pancreas cancer screening cohort  ----- Message ----- From: Irving Copas., MD Sent: 02/23/2021   3:27 PM EDT To: Milus Banister, MD, Lavena Bullion, DO, # Subject: Follow-up high risk pancreas cancer screenin#  [place], This patient is overdue for her EUS for pancreatic cancer screening high risk cohort. Looks like you tried to reach out to her but she did not call back. Please move forward with trying to get a hold of her and schedule her first available with DJ or myself. Please let Dr. Bryan Lemma and the provider who will do her EUS know when she is scheduled. Thanks. GM

## 2021-02-25 NOTE — Telephone Encounter (Signed)
Left message on machine to call back  

## 2021-02-26 ENCOUNTER — Other Ambulatory Visit: Payer: Self-pay

## 2021-02-26 DIAGNOSIS — Z800 Family history of malignant neoplasm of digestive organs: Secondary | ICD-10-CM

## 2021-02-26 NOTE — Telephone Encounter (Signed)
The pt has been scheduled for EUS on 11/3 at 11 am at [facility] with DJ.  The pt has been advised and instructed.  Instructions will also be sent to My Chart.

## 2021-03-02 ENCOUNTER — Telehealth: Payer: Self-pay | Admitting: Hematology and Oncology

## 2021-03-02 NOTE — Telephone Encounter (Signed)
Per 10/4 sch msg, pt was called and confirmed new appt.

## 2021-03-05 ENCOUNTER — Inpatient Hospital Stay: Payer: No Typology Code available for payment source | Admitting: Hematology and Oncology

## 2021-03-11 ENCOUNTER — Inpatient Hospital Stay: Payer: Managed Care, Other (non HMO) | Admitting: Hematology and Oncology

## 2021-03-17 ENCOUNTER — Inpatient Hospital Stay: Payer: Managed Care, Other (non HMO) | Admitting: Hematology and Oncology

## 2021-03-22 ENCOUNTER — Encounter (HOSPITAL_COMMUNITY): Payer: Self-pay | Admitting: Gastroenterology

## 2021-03-23 ENCOUNTER — Telehealth: Payer: Self-pay | Admitting: Gastroenterology

## 2021-03-23 NOTE — Progress Notes (Signed)
Attempted to obtain medical history via telephone, unable to reach at this time. I left a voicemail to return pre surgical testing department's phone call.  

## 2021-03-23 NOTE — Telephone Encounter (Signed)
Left message on machine to call back  

## 2021-03-23 NOTE — Telephone Encounter (Signed)
Patient called and needs to reschedule colonoscopy scheduled at [facility].  Please call.  Thank you.

## 2021-03-23 NOTE — Telephone Encounter (Signed)
The pt has been rescheduled to 05/13/21 at 730 am at [facility] with DJ.  She has been re instructed and will call with any questions or concerns.

## 2021-03-29 NOTE — Progress Notes (Incomplete)
° °  Patient Care Team: Patient, No Pcp Per (Inactive) as PCP - General ([place]) Rolm Bookbinder, MD as Consulting Physician (General Surgery)  DIAGNOSIS: No diagnosis found.  SUMMARY OF ONCOLOGIC HISTORY: Oncology History   No history exists.    CHIEF COMPLIANT: Follow-up of high risk for breast cancer  INTERVAL HISTORY: April Valdez is a 35 y.o. with above-mentioned history of high risk for breast cancer, and positive for BRCA2 gene mutation. She presents to the clinic today for follow-up.   ALLERGIES:  has No Known Allergies.  MEDICATIONS:  Current Outpatient Medications  Medication Sig Dispense Refill   Multiple Vitamins-Minerals (MULTIVITAMIN GUMMIES ADULT PO) Take 2 tablets by mouth daily.     tamoxifen (NOLVADEX) 20 MG tablet Take 0.5 tablets (10 mg total) by mouth daily. 90 tablet 3   No current facility-administered medications for this visit.    PHYSICAL EXAMINATION: ECOG PERFORMANCE STATUS: {CHL ONC ECOG [phone]}  There were no vitals filed for this visit. There were no vitals filed for this visit.  BREAST:*** No palpable masses or nodules in either right or left breasts. No palpable axillary supraclavicular or infraclavicular adenopathy no breast tenderness or nipple discharge. (exam performed in the presence of a chaperone)  LABORATORY DATA:  I have reviewed the data as listed CMP Latest Ref Rng & Units 02/24/2020 12/06/2015 01/21/2015  Glucose 70 - 99 mg/dL 102(H) 100(H) 92  BUN 6 - 23 mg/dL _0 Creatinine 0.40 - 1.20 mg/dL 0.67 0.57 0.68  Sodium 135 - 145 mEq/L 141 135 139  Potassium 3.5 - 5.1 mEq/L 4.2 3.7 3.8  Chloride 96 - 112 mEq/L 108 105 106  CO2 19 - 32 mEq/L _1 Calcium 8.4 - 10.5 mg/dL 8.9 8.8(L) 9.3  Total Protein 6.5 - 8.1 g/dL - - 7.8  Total Bilirubin 0.3 - 1.2 mg/dL - - 0.4  Alkaline Phos 38 - 126 U/L - - 46  AST 15 - 41 U/L - - 15  ALT 14 - 54 U/L - - 15    Lab Results  Component Value Date   WBC 4.6  12/06/2015   HGB 11.0 (L) 12/06/2015   HCT 33.4 (L) 12/06/2015   MCV 81.7 12/06/2015   PLT 227 12/06/2015   NEUTROABS 2.2 12/06/2015    ASSESSMENT & PLAN:  No problem-specific Assessment & Plan notes found for this encounter.    No orders of the defined types were placed in this encounter.  The patient has a good understanding of the overall plan. she agrees with it. she will call with any problems that may develop before the next visit here.  Total time spent: *** mins including face to face time and time spent for planning, charting and coordination of care  Rulon Eisenmenger, MD, MPH 03/29/2021  I, Thana Ates, am acting as scribe for Dr. Nicholas Lose.  {insert scribe attestation}

## 2021-03-29 NOTE — Assessment & Plan Note (Deleted)
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 She will also likely need evaluation for the axillary lymph node.  Surveillance: 1. Breast MRI 11/06/2019: 4.4 cm indeterminate non-mass enhancement, multiple enlarged level 1 and level 2 left axillary lymph nodes surgical changes left breast consistent with intraductal papilloma. 2. left axillary ultrasound 01/15/2020: Decrease in thickness of the 2 left axillary lymph nodes 3. MRI abdomen: MRCP: No evidence of pancreatic mass mild hepatic steatosis  Plan: Patient scheduled to undergo breast conserving surgery on 03/17/2020. Risk reduction: Tamoxifen x5 years Tamoxifen toxicities: 1.  Numbness in the hands intermittently 2. severe hot flashes We decrease the dosage of tamoxifen to 10 mg daily today.   I ordered an MRI of the breast to be done in June 2022.  Low blood pressure: I will refer her to Dr. Haroldine Laws with cardiology. Return to clinic in 1 year for follow-up

## 2021-03-30 ENCOUNTER — Inpatient Hospital Stay: Payer: Managed Care, Other (non HMO) | Admitting: Hematology and Oncology

## 2021-03-30 DIAGNOSIS — Z1501 Genetic susceptibility to malignant neoplasm of breast: Secondary | ICD-10-CM

## 2021-04-10 NOTE — Progress Notes (Signed)
Patient Care Team: Patient, No Pcp Per (Inactive) as PCP - General ([place]) Rolm Bookbinder, MD as Consulting Physician (General Surgery)  DIAGNOSIS:    ICD-10-CM   1. BRCA2 gene mutation positive in female  Z15.01    Z15.02    Z15.09        CHIEF COMPLIANT: Follow-up of high risk for breast cancer  INTERVAL HISTORY: April Valdez is a 35 y.o. with above-mentioned history of high risk for breast cancer, and positive for BRCA2 gene mutation. She presents to the clinic today for follow-up.  Patient tells me that she has not been compliant with tamoxifen.  She has been taking it less than once a week.  She denies any lumps or nodules in the breast.  She had a mammogram at her gynecologist office.  She has not had a breast MRI.  ALLERGIES:  has No Known Allergies.  MEDICATIONS:  Current Outpatient Medications  Medication Sig Dispense Refill   Multiple Vitamins-Minerals (MULTIVITAMIN GUMMIES ADULT PO) Take 2 tablets by mouth daily.     tamoxifen (NOLVADEX) 20 MG tablet Take 0.5 tablets (10 mg total) by mouth daily. 90 tablet 3   No current facility-administered medications for this visit.    PHYSICAL EXAMINATION: ECOG PERFORMANCE STATUS: 1 - Symptomatic but completely ambulatory  Vitals:   04/12/21 1410  BP: (!) 126/55  Pulse: 83  Resp: 18  Temp: 97.9 F (36.6 C)  SpO2: 100%   Filed Weights   04/12/21 1410  Weight: 235 lb 11.2 oz (106.9 kg)       LABORATORY DATA:  I have reviewed the data as listed CMP Latest Ref Rng & Units 02/24/2020 12/06/2015 01/21/2015  Glucose 70 - 99 mg/dL 102(H) 100(H) 92  BUN 6 - 23 mg/dL '16 14 12  ' Creatinine 0.40 - 1.20 mg/dL 0.67 0.57 0.68  Sodium 135 - 145 mEq/L 141 135 139  Potassium 3.5 - 5.1 mEq/L 4.2 3.7 3.8  Chloride 96 - 112 mEq/L 108 105 106  CO2 19 - 32 mEq/L '27 26 27  ' Calcium 8.4 - 10.5 mg/dL 8.9 8.8(L) 9.3  Total Protein 6.5 - 8.1 g/dL - - 7.8  Total Bilirubin 0.3 - 1.2 mg/dL - - 0.4  Alkaline Phos 38 -  126 U/L - - 46  AST 15 - 41 U/L - - 15  ALT 14 - 54 U/L - - 15    Lab Results  Component Value Date   WBC 4.6 12/06/2015   HGB 11.0 (L) 12/06/2015   HCT 33.4 (L) 12/06/2015   MCV 81.7 12/06/2015   PLT 227 12/06/2015   NEUTROABS 2.2 12/06/2015    ASSESSMENT & PLAN:  BRCA2 gene mutation positive in female Average to risk of cancer by age of 76: (Antoniou et al pooled pedigree data from 43 studies of 8139 index patients with breast or ovarian cancer) Breast cancer risk: 45 percent (95% CI, 33 to 54 percent) Ovarian cancer risk: 11 percent (95% CI, 4.1 to 18 percent)   Left lumpectomy: 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 She will also likely need evaluation for the axillary lymph node.   Surveillance: 1. Breast MRI 11/06/2019: 4.4 cm indeterminate non-mass enhancement, multiple enlarged level 1 and level 2 left axillary lymph nodes surgical changes left breast consistent with intraductal papilloma. 2. left axillary ultrasound 01/15/2020: Decrease in thickness of the 2 left axillary lymph nodes 3. MRI abdomen:  MRCP: No evidence of pancreatic mass mild hepatic steatosis 4.  Mammogram 09/21/2020: [facility]: Benign, breast density category B   Plan: breast conserving surgery on 03/17/2020: CSL, UDH, Papilloma Risk reduction: Tamoxifen x5 years (patient did not take tamoxifen therefore we discontinued it) Encouraged her to see a primary care physician regarding the numbness of her hands.  I ordered an MRI of the breast to be done in the next month or 2. We will call her with the results of the breast MRI.  Return to clinic in 1 year for follow-up   No orders of the defined types were placed in this encounter.  The patient has a good understanding of the overall plan. she agrees with it. she will call with any problems that may develop before the next  visit here.  Total time spent: 20 mins including face to face time and time spent for planning, charting and coordination of care  Rulon Eisenmenger, MD, MPH 04/12/2021  I, Thana Ates, am acting as scribe for Dr. Nicholas Lose.  I have reviewed the above documentation for accuracy and completeness, and I agree with the above.

## 2021-04-12 ENCOUNTER — Inpatient Hospital Stay: Payer: Managed Care, Other (non HMO) | Attending: Hematology and Oncology | Admitting: Hematology and Oncology

## 2021-04-12 ENCOUNTER — Other Ambulatory Visit: Payer: Self-pay

## 2021-04-12 DIAGNOSIS — D242 Benign neoplasm of left breast: Secondary | ICD-10-CM

## 2021-04-12 DIAGNOSIS — Z1502 Genetic susceptibility to malignant neoplasm of ovary: Secondary | ICD-10-CM

## 2021-04-12 DIAGNOSIS — Z1501 Genetic susceptibility to malignant neoplasm of breast: Secondary | ICD-10-CM | POA: Insufficient documentation

## 2021-04-12 NOTE — Assessment & Plan Note (Signed)
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 She will also likely need evaluation for the axillary lymph node.  Surveillance: 1. Breast MRI 11/06/2019: 4.4 cm indeterminate non-mass enhancement, multiple enlarged level 1 and level 2 left axillary lymph nodes surgical changes left breast consistent with intraductal papilloma. 2. left axillary ultrasound 01/15/2020: Decrease in thickness of the 2 left axillary lymph nodes 3. MRI abdomen: MRCP: No evidence of pancreatic mass mild hepatic steatosis  Plan: breast conserving surgery on 03/17/2020: CSL, UDH, Papilloma Risk reduction: Tamoxifen x5 years  Tamoxifen toxicities: 1.  Numbness in the hands intermittently 2. severe hot flashes We decreased the dosage of tamoxifen to 10 mg daily today.   I ordered an MRI of the breast to be done in June 2022.  Low blood pressure: I will refer her to Dr. Haroldine Laws with cardiology.  Return to clinic in 1 year for follow-up

## 2021-04-17 IMAGING — US US BREAST BX W LOC DEV 1ST LESION IMG BX SPEC US GUIDE*L*
1 series · 12 of 13 positions shown · non-contrast
Comparison: Previous exam(s).
COMPARISON: Previous exam(s).

Addendum:
CLINICAL DATA: 33-year-old female presenting for ultrasound-guided
biopsy of 2 masses in the left breast. Due to their close proximity
today when rescanning, (masses are 4 mm apart) these masses were
biopsied together as one lesion.

EXAM:
ULTRASOUND GUIDED RIGHT BREAST CORE NEEDLE BIOPSY

[Series 1: us breast bx w loc dev 1st lesion img bx spec us g · 0.07mm/px · 12 of 13 slices shown]
[im 1/13]
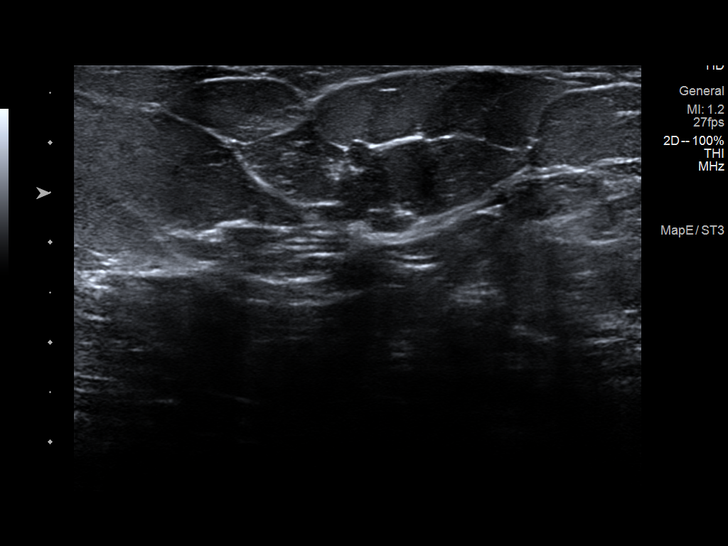
[im 2/13]
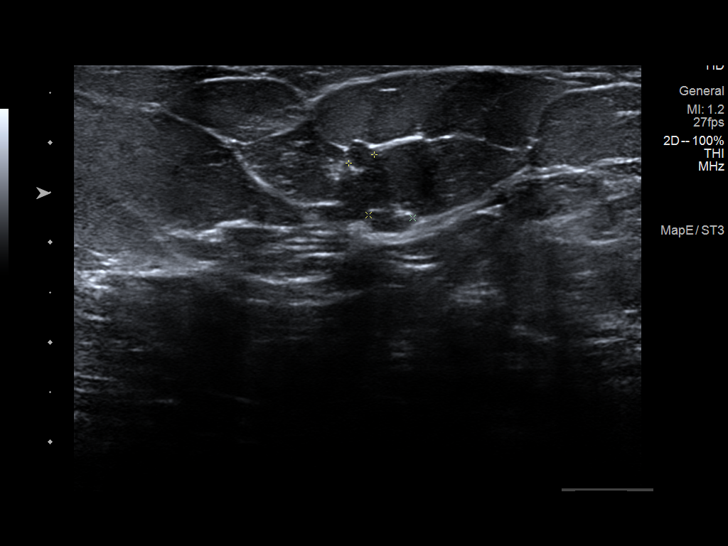
[im 3/13]
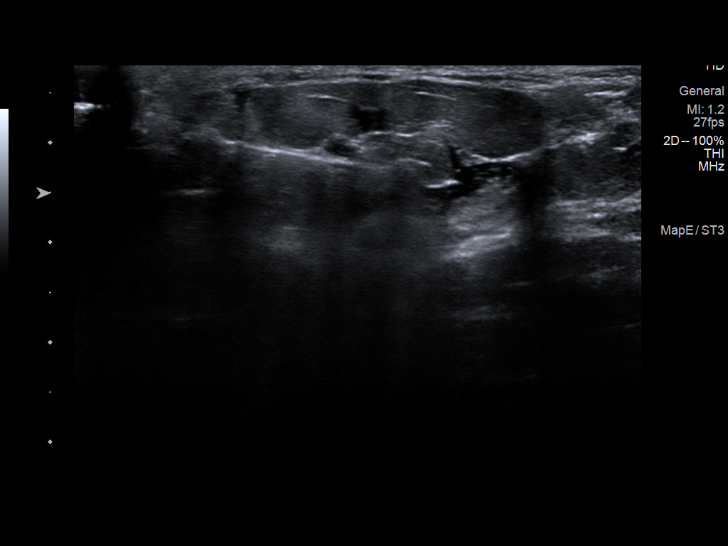
[im 4/13]
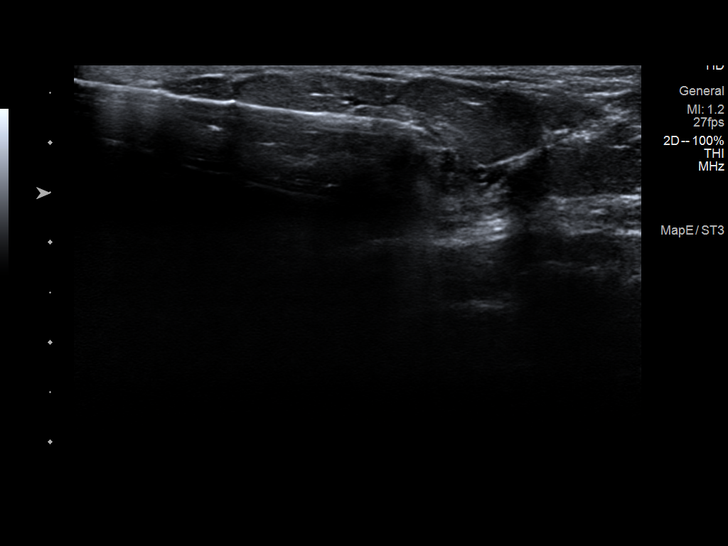
[im 5/13]
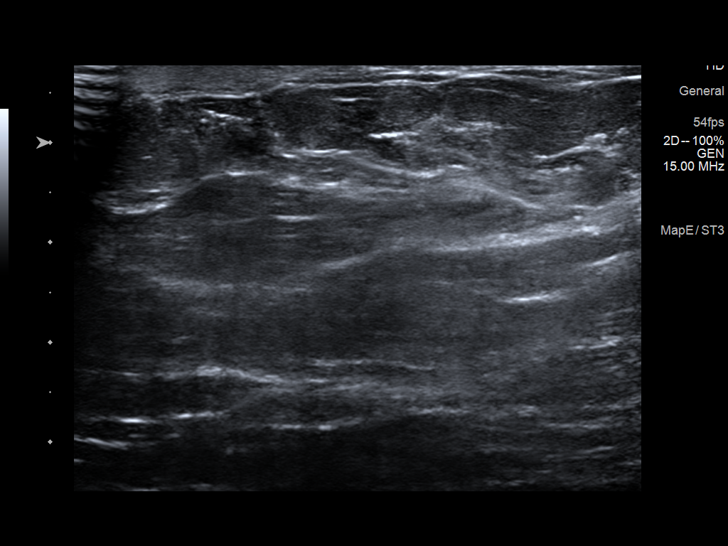
[im 6/13]
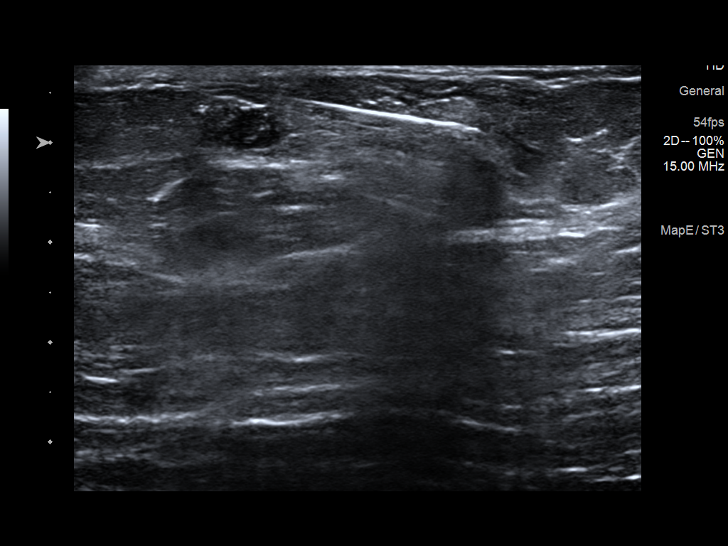
[im 8/13]
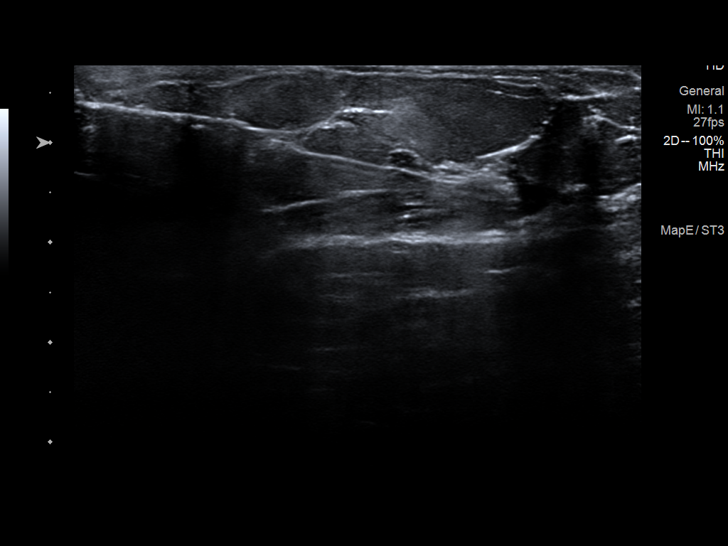
[im 9/13]
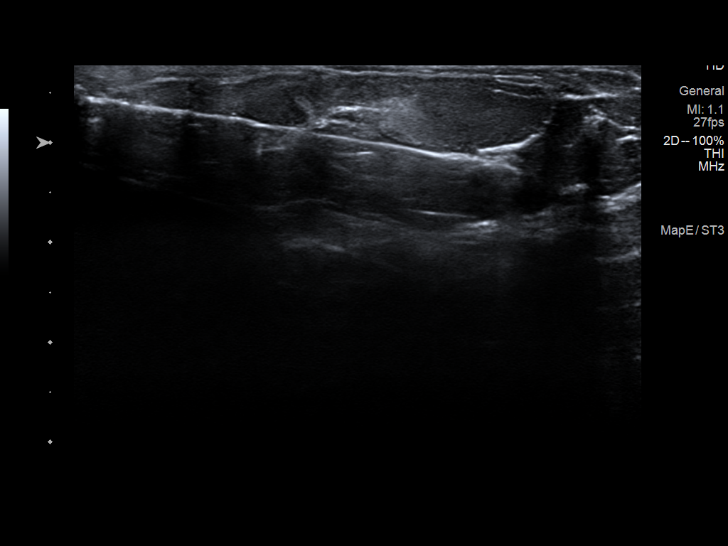
[im 10/13]
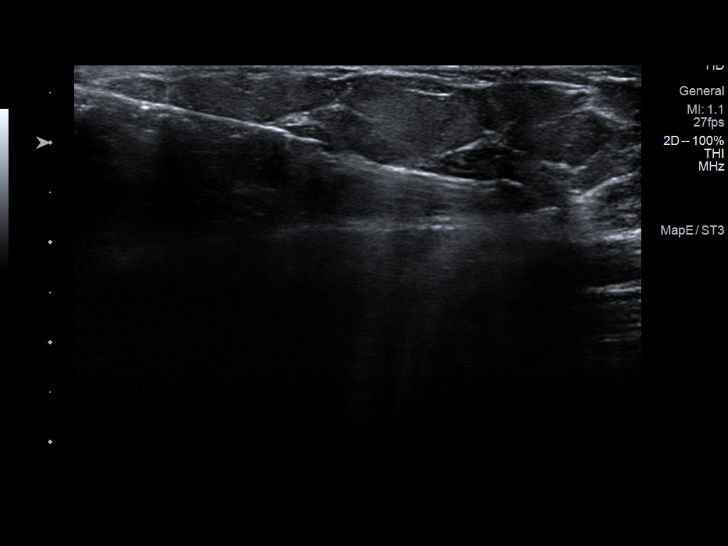
[im 11/13]
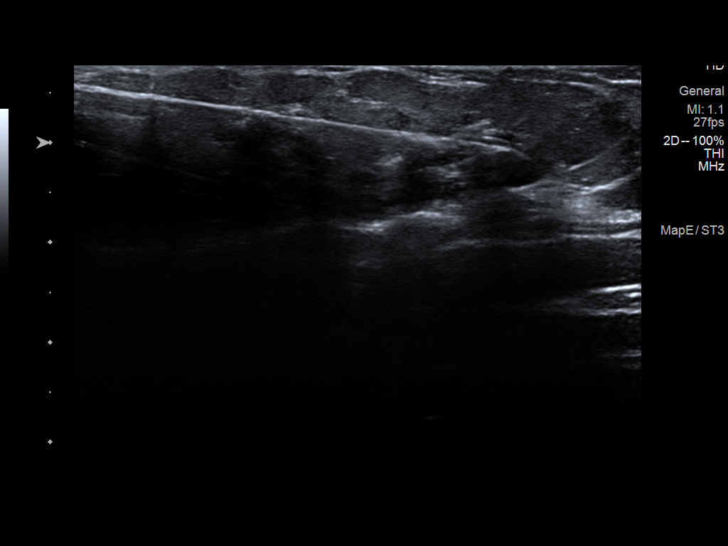
[im 12/13]
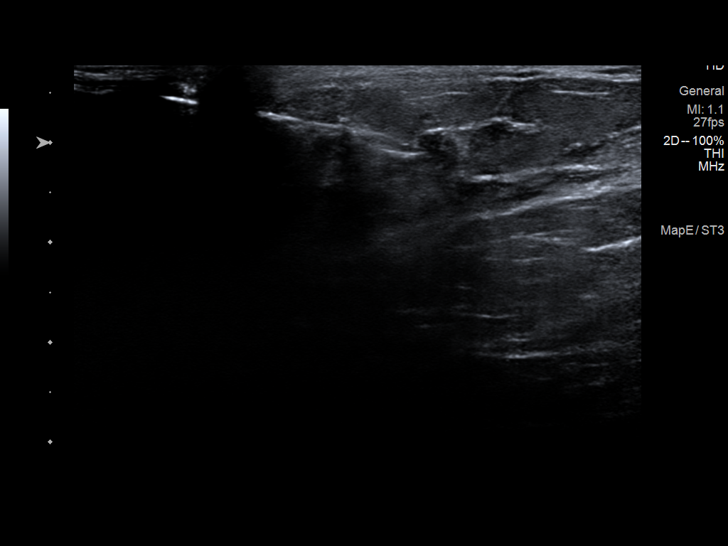
[im 13/13]
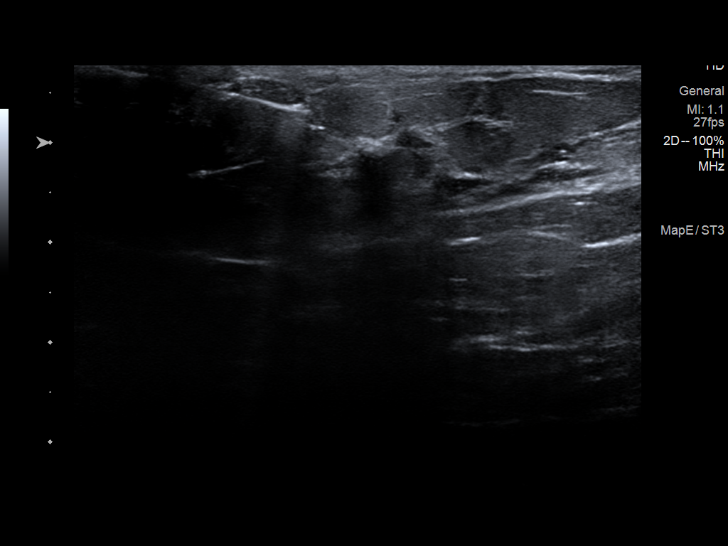

[12 of 13 positions shown; findings below may reference images not displayed]



Lesion quadrant: Lower-inner quadrant

While pre scanning in preparation for biopsy, the masses were again
identified and noted to be 4 mm apart. Therefore, the lesions were
biopsied together as one.

Using sterile technique and 1% Lidocaine as local anesthetic, under
direct ultrasound visualization, a 14 gauge Hlala device was
used to perform biopsy of 2 adjacent masses in the left breast at 7
o'clock, 9 cm from the nipple (biopsied together as 1 lesion) using
a medial approach. At the conclusion of the procedure ribbon shaped
tissue marker clip was deployed into the biopsy cavity between the 2
masses. Follow up 2 view mammogram was performed and dictated
separately.
IMPRESSION: Ultrasound guided biopsy of the 2 adjacent masses in the left breast
at 7 o'clock (biopsied together as one lesion). No apparent
complications.

ADDENDUM:
EXAM DESCRIPTION SHOULD BE: US BREAST BX ARTIS PANZO 1ST LESION IMG BX
SPEC US GUIDE*L*



Lesion quadrant: Lower-inner quadrant

While pre scanning in preparation for biopsy, the masses were again
identified and noted to be 4 mm apart. Therefore, the lesions were
biopsied together as one.

Using sterile technique and 1% Lidocaine as local anesthetic, under
direct ultrasound visualization, a 14 gauge Hlala device was
used to perform biopsy of 2 adjacent masses in the left breast at 7
o'clock, 9 cm from the nipple (biopsied together as 1 lesion) using
a medial approach. At the conclusion of the procedure ribbon shaped
tissue marker clip was deployed into the biopsy cavity between the 2
masses. Follow up 2 view mammogram was performed and dictated
separately.
IMPRESSION: Ultrasound guided biopsy of the 2 adjacent masses in the left breast
at 7 o'clock (biopsied together as one lesion). No apparent
complications.

## 2021-05-03 ENCOUNTER — Encounter (HOSPITAL_COMMUNITY): Payer: Self-pay | Admitting: Gastroenterology

## 2021-05-03 NOTE — Progress Notes (Signed)
Attempted to obtain medical history via telephone, unable to reach at this time. I left a voicemail to return pre surgical testing department's phone call.  

## 2021-05-06 NOTE — Telephone Encounter (Signed)
Pt has cancelled procedure twice now. Pt stated she would call back next month to reschedule.

## 2021-05-06 NOTE — Telephone Encounter (Signed)
Inbound call from patient to cancel procedure 12/15 at [facility] due to no transportation. Patient states she will call next month to get it rescheduled.

## 2021-05-13 ENCOUNTER — Encounter (HOSPITAL_COMMUNITY): Admission: RE | Payer: Self-pay | Source: Home / Self Care

## 2021-05-13 ENCOUNTER — Ambulatory Visit (HOSPITAL_COMMUNITY)
Admission: RE | Admit: 2021-05-13 | Payer: Managed Care, Other (non HMO) | Source: Home / Self Care | Admitting: Gastroenterology

## 2021-05-13 SURGERY — UPPER ENDOSCOPIC ULTRASOUND (EUS) RADIAL
Anesthesia: Monitor Anesthesia Care

## 2021-05-16 ENCOUNTER — Ambulatory Visit
Admission: RE | Admit: 2021-05-16 | Discharge: 2021-05-16 | Disposition: A | Discharge status: Home / Self Care | Payer: Managed Care, Other (non HMO) | Source: Ambulatory Visit | Attending: Hematology and Oncology | Admitting: Hematology and Oncology

## 2021-05-16 DIAGNOSIS — Z1509 Genetic susceptibility to other malignant neoplasm: Secondary | ICD-10-CM

## 2021-05-16 MED ORDER — GADOBUTROL 1 MMOL/ML IV SOLN
10.0000 mL | Freq: Once | INTRAVENOUS | Status: AC | PRN
  Administered 2021-05-16: 12:00:00 10 mL via INTRAVENOUS

## 2021-05-17 ENCOUNTER — Telehealth: Payer: Self-pay | Admitting: Hematology and Oncology

## 2021-05-17 ENCOUNTER — Other Ambulatory Visit: Payer: Self-pay | Admitting: Hematology and Oncology

## 2021-05-17 NOTE — Telephone Encounter (Signed)
I informed her that the breast MRI was normal

## 2021-07-13 ENCOUNTER — Encounter (HOSPITAL_COMMUNITY): Payer: Self-pay

## 2021-11-30 IMAGING — MR MR ABDOMEN WO/W CM MRCP
17 of 22 series · 39 of 48 positions shown · IV contrast (gadavist)
Comparison: None

CLINICAL DATA: Family history of pancreatic and breast cancer.

EXAM:
MRI ABDOMEN WITHOUT AND WITH CONTRAST (INCLUDING MRCP)
TECHNIQUE: Multiplanar multisequence MR imaging of the abdomen was performed
both before and after the administration of intravenous contrast.
Heavily T2-weighted images of the biliary and pancreatic ducts were
obtained, and three-dimensional MRCP images were rendered by post
processing.
CONTRAST:  10mL GADAVIST GADOBUTROL 1 MMOL/ML IV SOLN

[Series 3: T2 fat-sat · axial · 6.0mm · 1.25mm/px · 1 of 36 slices shown]
[im 1/36]
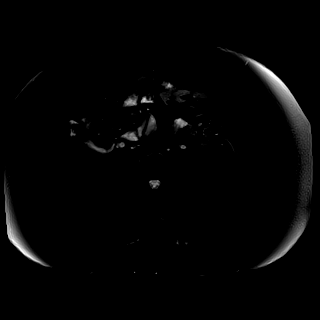

[Series 5: T2 · coronal · 6.0mm · 1.62mm/px · 1 of 30 slices shown (1 of 2)]
[im 1/30]
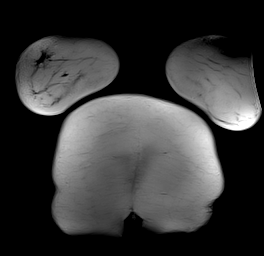

[Series 6: DWI · axial · 6.0mm · 1.49mm/px · z∈[-82,+170]mm · 3 of 72 slices shown (1 of 2)]
[im 1/72]
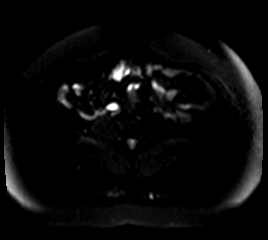
[im 36/72]
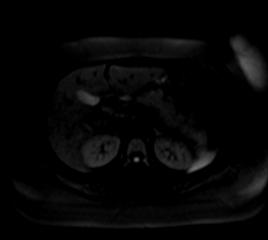
[im 72/72]
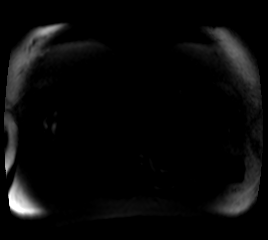

[Series 7: DWI · axial · 6.0mm · 1.49mm/px · 1 of 36 slices shown (2 of 2)]
[im 1/36]
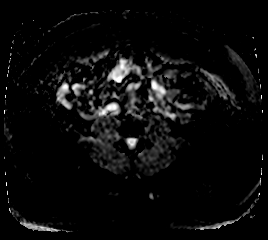

[Series 9: T1 · axial · 3.2mm · 1.28mm/px · z∈[-68,+159]mm · 3 of 72 slices shown (1 of 2)]
[im 1/72]
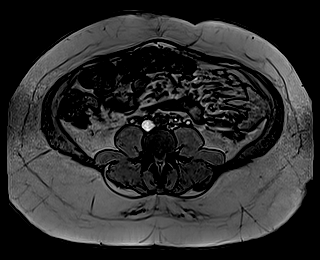
[im 36/72]
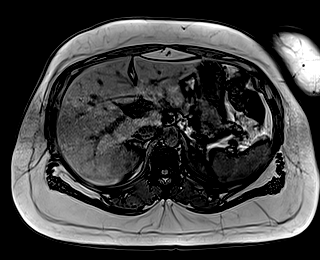
[im 72/72]
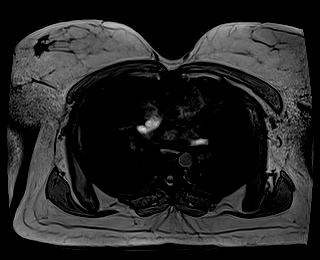

[Series 10: T1 · axial · 3.2mm · 1.28mm/px · z∈[-68,+159]mm · 3 of 72 slices shown (2 of 2)]
[im 1/72]
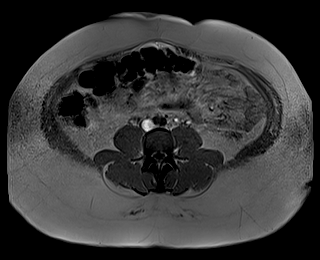
[im 36/72]
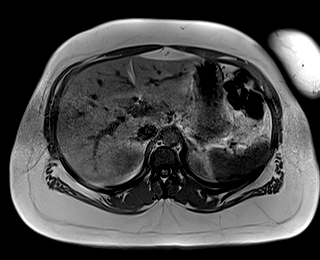
[im 72/72]
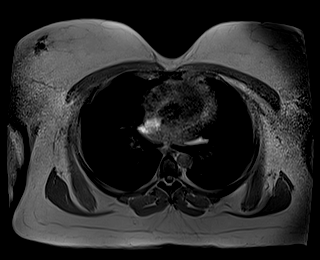

[Series 11: cor obl thk · sagittal · 50.0mm · 0.78mm/px · 1 of 9 slices shown]
[im 1/9]
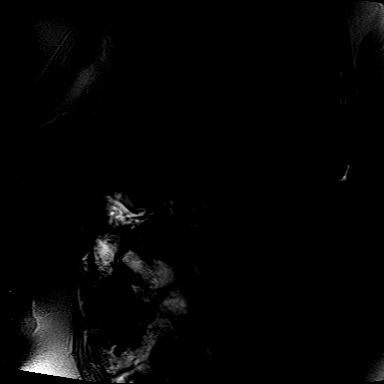

[Series 13: cor_3d_spc_trig · coronal · 1.0mm · 0.49mm/px · 3 of 80 slices shown]
[im 1/80]
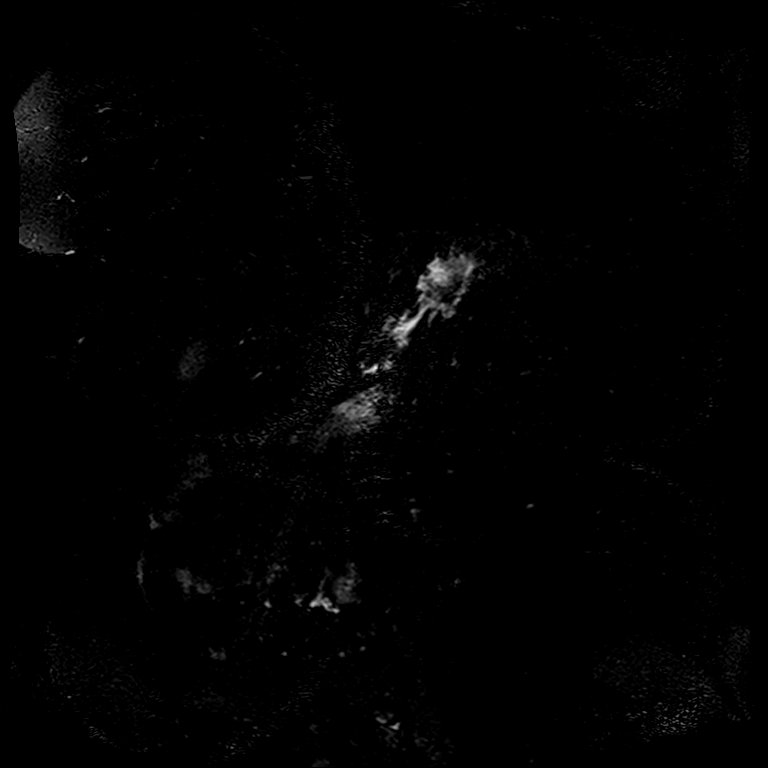
[im 40/80]
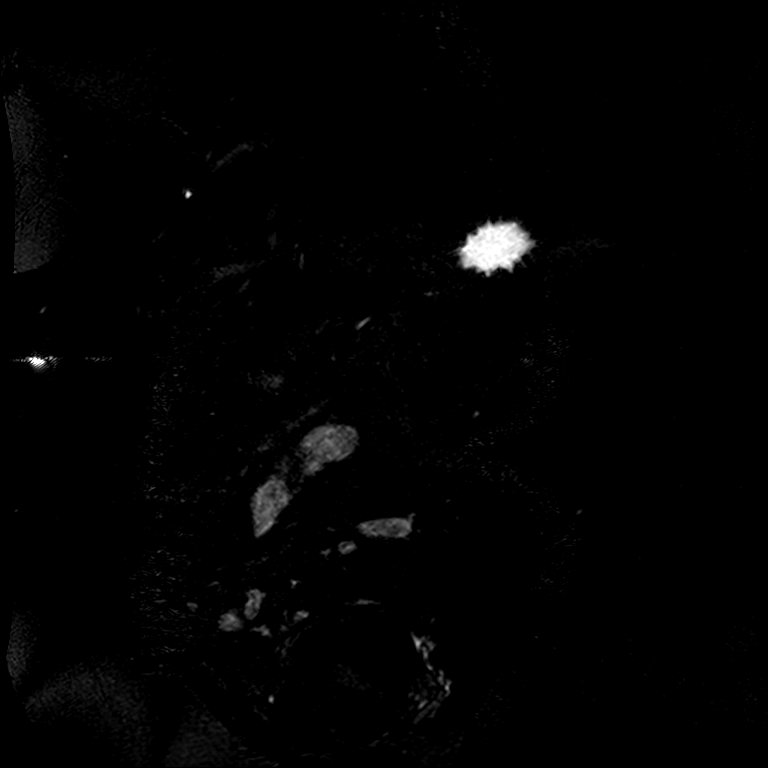
[im 80/80]
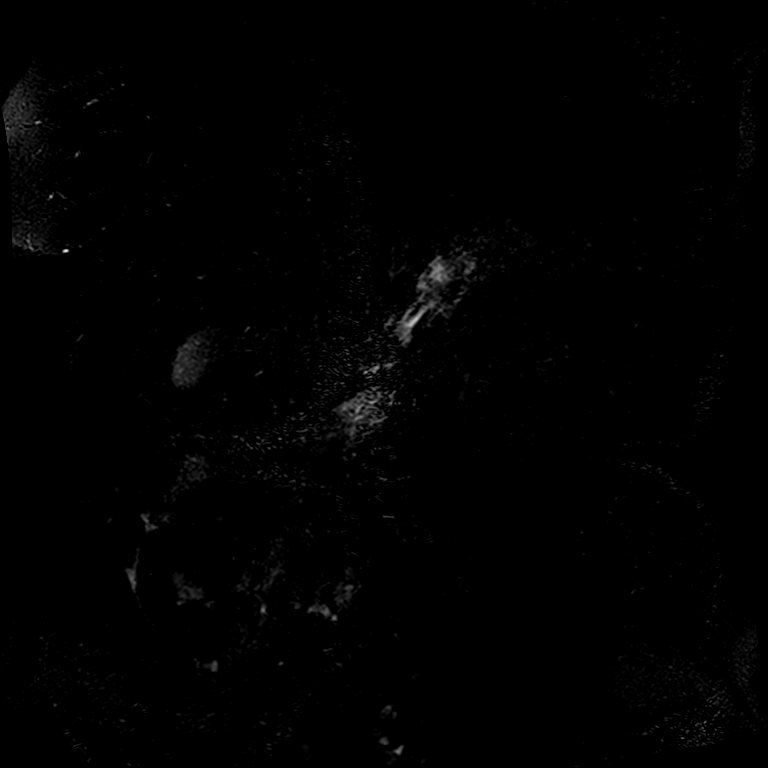

[Series 15: T2 · axial · 6.0mm · 1.56mm/px · 1 of 30 slices shown (2 of 2)]
[im 1/30]
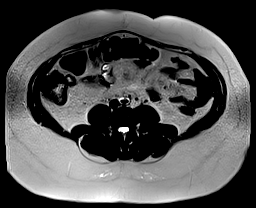

[Series 17: T1 dynamic · axial · 3.0mm · 1.25mm/px · z∈[-93,+144]mm · 3 of 80 slices shown (1 of 6)]
[im 1/80]
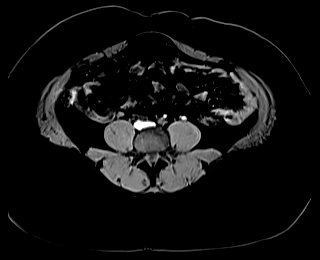
[im 40/80]
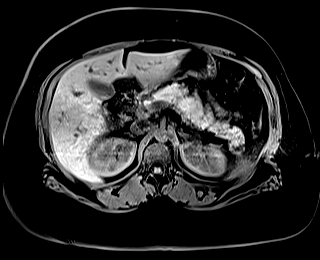
[im 80/80]
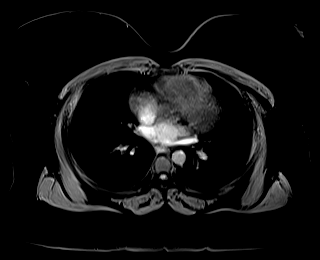

[Series 21: T1 dynamic · axial · 3.0mm · 1.25mm/px · z∈[-93,+144]mm · 3 of 80 slices shown (2 of 6)]
[im 1/80]
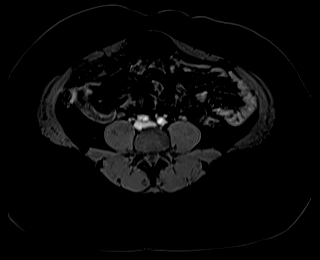
[im 40/80]
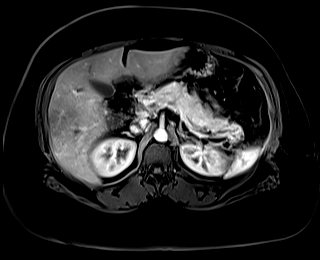
[im 80/80]
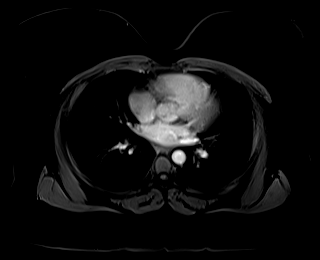

[Series 23: T1 dynamic · axial · 3.0mm · 1.25mm/px · z∈[-93,+144]mm · 3 of 80 slices shown (3 of 6)]
[im 1/80]
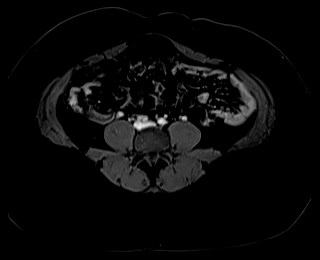
[im 40/80]
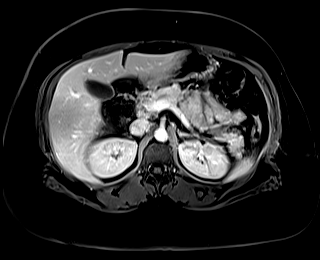
[im 80/80]
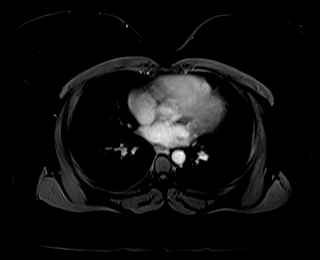

[Series 25: T1 dynamic · axial · 3.0mm · 1.25mm/px · z∈[-93,+144]mm · 3 of 80 slices shown (4 of 6)]
[im 1/80]
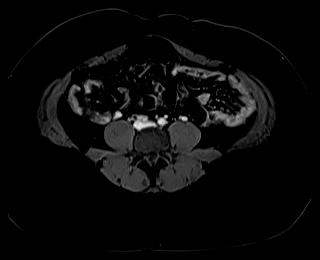
[im 40/80]
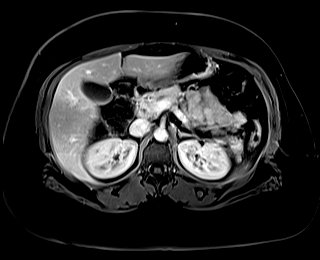
[im 80/80]
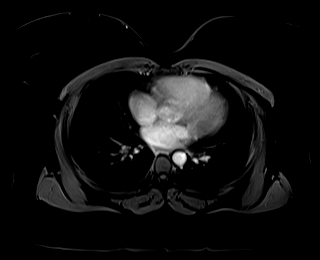

[Series 27: T1 dynamic · coronal · 5.0mm · 1.41mm/px · 2 of 48 slices shown (5 of 6)]
[im 1/48]
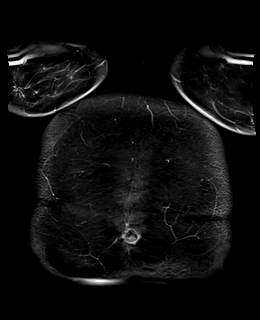
[im 48/48]
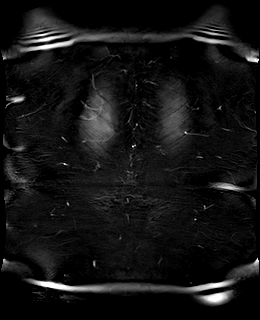

[Series 29: T1 dynamic · axial · 3.0mm · 1.25mm/px · z∈[-93,+144]mm · 3 of 80 slices shown (6 of 6)]
[im 1/80]
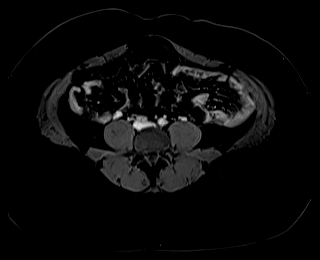
[im 40/80]
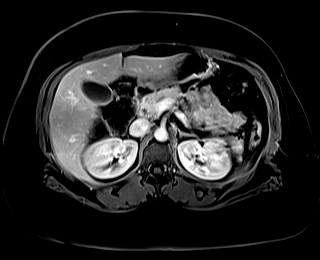
[im 80/80]
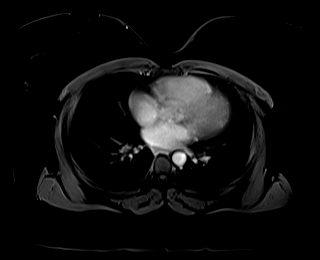

[Series 102: sub_20 sec · axial · 3.0mm · 1.25mm/px · z∈[-93,+144]mm · 3 of 80 slices shown]
[im 1/80]
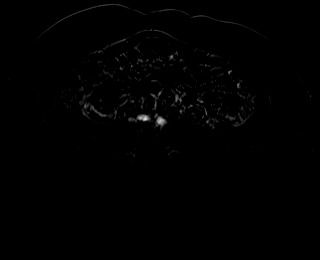
[im 40/80]
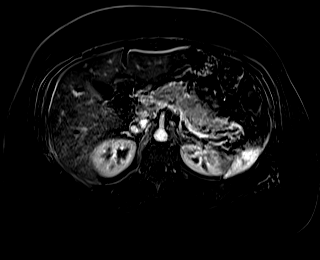
[im 80/80]
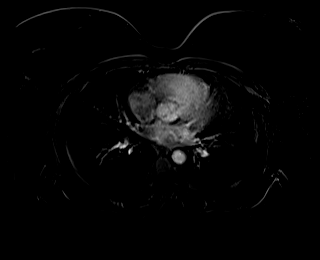

[Series 103: sub_45 sec · axial · 3.0mm · 1.25mm/px · z∈[-93,+24]mm · 2 of 80 slices shown]
[im 1/80]
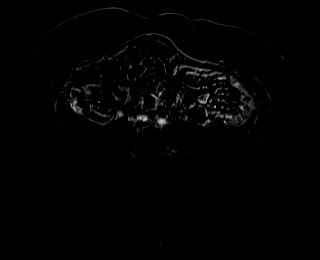
[im 40/80]
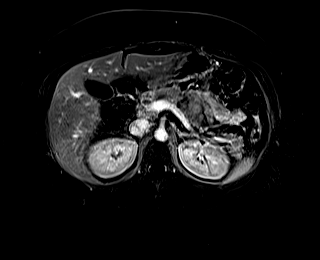

[39 of 48 positions shown; findings below may reference images not displayed]

FINDINGS: Lower chest: No acute findings.

Hepatobiliary: Mild hepatic steatosis. No focal liver abnormality
identified. The gallbladder appears normal. No bile duct dilatation
identified.

Pancreas: No main duct dilatation, inflammation or mass identified.
Pancreas divisum anatomy is identified.

Spleen:  Within normal limits in size and appearance.

Adrenals/Urinary Tract: Normal appearance of the adrenal glands.
There is no kidney mass or hydronephrosis identified bilaterally.
Small cystic lesion within inferior pole of left kidney is too small
to characterize measuring 4 mm.

Stomach/Bowel: Visualized portions within the abdomen are
unremarkable.

Vascular/Lymphatic: No pathologically enlarged lymph nodes
identified. No abdominal aortic aneurysm demonstrated.

Other:  No free fluid or fluid collections identified.

Musculoskeletal: No suspicious bone lesions identified.
IMPRESSION: 1. No evidence for pancreatic mass or metastatic disease within the
abdomen.
2. Mild hepatic steatosis.

## 2022-03-18 ENCOUNTER — Other Ambulatory Visit: Payer: Self-pay | Admitting: General Surgery

## 2022-03-18 DIAGNOSIS — Z1231 Encounter for screening mammogram for malignant neoplasm of breast: Secondary | ICD-10-CM

## 2022-03-31 ENCOUNTER — Ambulatory Visit: Payer: Managed Care, Other (non HMO)

## 2022-04-10 NOTE — Progress Notes (Incomplete)
   Patient Care Team: Patient, No Pcp Per as PCP - General (General Practice) Rolm Bookbinder, MD as Consulting Physician (General Surgery)  DIAGNOSIS: No diagnosis found.  SUMMARY OF ONCOLOGIC HISTORY: Oncology History   No history exists.    CHIEF COMPLIANT: Follow-up of high risk for breast cancer   INTERVAL HISTORY: April Valdez is a 36 y.o. with above-mentioned history of high risk for breast cancer, and positive for BRCA2 gene mutation. She presents to the clinic today for follow-up.     ALLERGIES:  has No Known Allergies.  MEDICATIONS:  Current Outpatient Medications  Medication Sig Dispense Refill   Multiple Vitamins-Minerals (MULTIVITAMIN GUMMIES ADULT PO) Take 2 tablets by mouth daily.     valACYclovir (VALTREX) 500 MG tablet Take by mouth 2 (two) times daily as needed.     No current facility-administered medications for this visit.    PHYSICAL EXAMINATION: ECOG PERFORMANCE STATUS: {CHL ONC ECOG [phone]}  There were no vitals filed for this visit. There were no vitals filed for this visit.  BREAST:*** No palpable masses or nodules in either right or left breasts. No palpable axillary supraclavicular or infraclavicular adenopathy no breast tenderness or nipple discharge. (exam performed in the presence of a chaperone)  LABORATORY DATA:  I have reviewed the data as listed    Latest Ref Rng & Units 02/24/2020    8:23 AM 12/06/2015   10:27 AM 01/21/2015    7:00 PM  CMP  Glucose 70 - 99 mg/dL 102  100  92   BUN 6 - 23 mg/dL _0 Creatinine 0.40 - 1.20 mg/dL 0.67  0.57  0.68   Sodium 135 - 145 mEq/L 141  135  139   Potassium 3.5 - 5.1 mEq/L 4.2  3.7  3.8   Chloride 96 - 112 mEq/L 108  105  106   CO2 19 - 32 mEq/L _1 Calcium 8.4 - 10.5 mg/dL 8.9  8.8  9.3   Total Protein 6.5 - 8.1 g/dL   7.8   Total Bilirubin 0.3 - 1.2 mg/dL   0.4   Alkaline Phos 38 - 126 U/L   46   AST 15 - 41 U/L   15   ALT 14 - 54 U/L   15     Lab Results   Component Value Date   WBC 4.6 12/06/2015   HGB 11.0 (L) 12/06/2015   HCT 33.4 (L) 12/06/2015   MCV 81.7 12/06/2015   PLT 227 12/06/2015   NEUTROABS 2.2 12/06/2015    ASSESSMENT & PLAN:  No problem-specific Assessment & Plan notes found for this encounter.    No orders of the defined types were placed in this encounter.  The patient has a good understanding of the overall plan. she agrees with it. she will call with any problems that may develop before the next visit here. Total time spent: 30 mins including face to face time and time spent for planning, charting and co-ordination of care   April Valdez, [place] 04/10/22    I Gardiner Coins am scribing for Dr. Lindi Adie  ***

## 2022-04-12 ENCOUNTER — Inpatient Hospital Stay: Payer: Managed Care, Other (non HMO) | Admitting: Hematology and Oncology

## 2022-04-12 NOTE — Assessment & Plan Note (Deleted)
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 Breast MRI 11/06/2019: 4.4 cm indeterminate non-mass enhancement, multiple enlarged level 1 and level 2 left axillary lymph nodes surgical changes left breast consistent with intraductal papilloma. 2. left axillary ultrasound 01/15/2020: Decrease in thickness of the 2 left axillary lymph nodes 3. MRI abdomen: MRCP: No evidence of pancreatic mass mild hepatic steatosis 4.  Mammogram 09/21/2020: [facility]: Benign, breast density category B 5. breast conserving surgery on 03/17/2020: CSL, UDH, Papilloma  Risk reduction: Recommended tamoxifen but patient refused.   Breast cancer surveillance: Breast MRI 05/17/2021: Benign breast density category A Mammogram scheduled for 05/27/2022.  I recommended annual breast MRIs to be done in June 2024.   Return to clinic in 1 year for follow-up

## 2022-04-22 NOTE — Progress Notes (Incomplete)
   Patient Care Team: Patient, No Pcp Per as PCP - General (General Practice) Rolm Bookbinder, MD as Consulting Physician (General Surgery)  DIAGNOSIS: No diagnosis found.  SUMMARY OF ONCOLOGIC HISTORY: Oncology History   No history exists.    CHIEF COMPLIANT: Follow-up high risk for breast cancer  INTERVAL HISTORY: April Valdez is a 36 y.o with the above-mentioned history of right breast cancer and positive for BRCA2 gene mutation. She presents to the clinic today for a follow-up.   ALLERGIES:  has No Known Allergies.  MEDICATIONS:  Current Outpatient Medications  Medication Sig Dispense Refill   Multiple Vitamins-Minerals (MULTIVITAMIN GUMMIES ADULT PO) Take 2 tablets by mouth daily.     valACYclovir (VALTREX) 500 MG tablet Take by mouth 2 (two) times daily as needed.     No current facility-administered medications for this visit.    PHYSICAL EXAMINATION: ECOG PERFORMANCE STATUS: {CHL ONC ECOG PS:[phone]}  There were no vitals filed for this visit. There were no vitals filed for this visit.  BREAST:*** No palpable masses or nodules in either right or left breasts. No palpable axillary supraclavicular or infraclavicular adenopathy no breast tenderness or nipple discharge. (exam performed in the presence of a chaperone)  LABORATORY DATA:  I have reviewed the data as listed    Latest Ref Rng & Units 02/24/2020    8:23 AM 12/06/2015   10:27 AM 01/21/2015    7:00 PM  CMP  Glucose 70 - 99 mg/dL 102  100  92   BUN 6 - 23 mg/dL _0 Creatinine 0.40 - 1.20 mg/dL 0.67  0.57  0.68   Sodium 135 - 145 mEq/L 141  135  139   Potassium 3.5 - 5.1 mEq/L 4.2  3.7  3.8   Chloride 96 - 112 mEq/L 108  105  106   CO2 19 - 32 mEq/L _1 Calcium 8.4 - 10.5 mg/dL 8.9  8.8  9.3   Total Protein 6.5 - 8.1 g/dL   7.8   Total Bilirubin 0.3 - 1.2 mg/dL   0.4   Alkaline Phos 38 - 126 U/L   46   AST 15 - 41 U/L   15   ALT 14 - 54 U/L   15     Lab Results   Component Value Date   WBC 4.6 12/06/2015   HGB 11.0 (L) 12/06/2015   HCT 33.4 (L) 12/06/2015   MCV 81.7 12/06/2015   PLT 227 12/06/2015   NEUTROABS 2.2 12/06/2015    ASSESSMENT & PLAN:  No problem-specific Assessment & Plan notes found for this encounter.    No orders of the defined types were placed in this encounter.  The patient has a good understanding of the overall plan. she agrees with it. she will call with any problems that may develop before the next visit here. Total time spent: 30 mins including face to face time and time spent for planning, charting and co-ordination of care   GESSICA JAWAD, [place] 04/22/22    I Gardiner Coins am scribing for Dr. Lindi Adie  ***

## 2022-04-26 ENCOUNTER — Inpatient Hospital Stay: Payer: Managed Care, Other (non HMO) | Admitting: Hematology and Oncology

## 2022-04-26 NOTE — Assessment & Plan Note (Deleted)
Average to risk of cancer by age of 28: (Antoniou et al pooled pedigree data from 16 studies of 8139 index patients with breast or ovarian cancer) Breast cancer risk: 45 percent (95% CI, 33 to 54 percent) Ovarian cancer risk: 11 percent (95% CI, 4.1 to 18 percent)   Left lumpectomy: April 2021 Dr. Donne Hazel: Intraductal papilloma Right breast biopsy 11/01/2019: Complex sclerosing lesion   Prior treatment: 1. Breast MRI 11/06/2019: 4.4 cm indeterminate non-mass enhancement, multiple enlarged level 1 and level 2 left axillary lymph nodes surgical changes left breast consistent with intraductal papilloma. 2. left axillary ultrasound 01/15/2020: Decrease in thickness of the 2 left axillary lymph nodes 3. MRI abdomen: MRCP: No evidence of pancreatic mass mild hepatic steatosis 4.  Mammogram 09/21/2020: [facility]: Benign, breast density category B 5. breast conserving surgery on 03/17/2020: CSL, UDH, Papilloma   Plan:  Risk reduction: Tamoxifen x5 years (patient did not take tamoxifen therefore we discontinued it) Breast MRI 05/17/2021: No evidence of malignancy Mammogram at [place] 09/21/2020: Benign breast density category B  Return to clinic in 1 year for follow-up

## 2022-04-30 NOTE — Progress Notes (Signed)
   Patient Care Team: Patient, No Pcp Per as PCP - General (General Practice) Rolm Bookbinder, MD as Consulting Physician (General Surgery)  DIAGNOSIS: No diagnosis found.  SUMMARY OF ONCOLOGIC HISTORY: Oncology History   No history exists.    CHIEF COMPLIANT:   INTERVAL HISTORY: April Valdez is a   ALLERGIES:  has No Known Allergies.  MEDICATIONS:  Current Outpatient Medications  Medication Sig Dispense Refill   Multiple Vitamins-Minerals (MULTIVITAMIN GUMMIES ADULT PO) Take 2 tablets by mouth daily.     valACYclovir (VALTREX) 500 MG tablet Take by mouth 2 (two) times daily as needed.     No current facility-administered medications for this visit.    PHYSICAL EXAMINATION: ECOG PERFORMANCE STATUS: {CHL ONC ECOG [phone]}  There were no vitals filed for this visit. There were no vitals filed for this visit.  BREAST:*** No palpable masses or nodules in either right or left breasts. No palpable axillary supraclavicular or infraclavicular adenopathy no breast tenderness or nipple discharge. (exam performed in the presence of a chaperone)  LABORATORY DATA:  I have reviewed the data as listed    Latest Ref Rng & Units 02/24/2020    8:23 AM 12/06/2015   10:27 AM 01/21/2015    7:00 PM  CMP  Glucose 70 - 99 mg/dL 102  100  92   BUN 6 - 23 mg/dL '16  14  12   '$ Creatinine 0.40 - 1.20 mg/dL 0.67  0.57  0.68   Sodium 135 - 145 mEq/L 141  135  139   Potassium 3.5 - 5.1 mEq/L 4.2  3.7  3.8   Chloride 96 - 112 mEq/L 108  105  106   CO2 19 - 32 mEq/L '27  26  27   '$ Calcium 8.4 - 10.5 mg/dL 8.9  8.8  9.3   Total Protein 6.5 - 8.1 g/dL   7.8   Total Bilirubin 0.3 - 1.2 mg/dL   0.4   Alkaline Phos 38 - 126 U/L   46   AST 15 - 41 U/L   15   ALT 14 - 54 U/L   15     Lab Results  Component Value Date   WBC 4.6 12/06/2015   HGB 11.0 (L) 12/06/2015   HCT 33.4 (L) 12/06/2015   MCV 81.7 12/06/2015   PLT 227 12/06/2015   NEUTROABS 2.2 12/06/2015    ASSESSMENT & PLAN:   No problem-specific Assessment & Plan notes found for this encounter.    No orders of the defined types were placed in this encounter.  The patient has a good understanding of the overall plan. she agrees with it. she will call with any problems that may develop before the next visit here. Total time spent: 30 mins including face to face time and time spent for planning, charting and co-ordination of care   JESSENIA FILIPPONE, [place] 04/30/22    I Gardiner Coins am scribing for Dr. Lindi Adie  ***

## 2022-05-03 ENCOUNTER — Other Ambulatory Visit: Payer: Self-pay

## 2022-05-03 ENCOUNTER — Inpatient Hospital Stay: Payer: Managed Care, Other (non HMO) | Attending: Hematology and Oncology | Admitting: Hematology and Oncology

## 2022-05-03 VITALS — BP 133/67 | HR 94 | Temp 97.9°F | Resp 18 | Ht 60.0 in | Wt 244.3 lb

## 2022-05-03 DIAGNOSIS — Z1502 Genetic susceptibility to malignant neoplasm of ovary: Secondary | ICD-10-CM

## 2022-05-03 DIAGNOSIS — Z1509 Genetic susceptibility to other malignant neoplasm: Secondary | ICD-10-CM

## 2022-05-03 DIAGNOSIS — Z1501 Genetic susceptibility to malignant neoplasm of breast: Secondary | ICD-10-CM | POA: Insufficient documentation

## 2022-05-03 NOTE — Assessment & Plan Note (Addendum)
Surveillance: 1. Breast MRI 05/27/2021: No evidence of malignancy.  Breast density category A 2. left axillary ultrasound 01/15/2020: Decrease in thickness of the 2 left axillary lymph nodes 3. MRI abdomen: MRCP: No evidence of pancreatic mass mild hepatic steatosis 4.  Mammogram 09/21/2020: [facility]: Benign, breast density category B 5.  Breast exam 05/03/2022: Benign   Breast conserving surgery on 03/17/2020: CSL, UDH, Papilloma Risk reduction: Patient was offered tamoxifen but she decided not to take it  Plan: Mammograms been scheduled for December.  Breast MRIs to be scheduled for June.  I discussed with her about the importance of staying compliant with the schedule. I will see her back in 1 year for follow-up

## 2022-05-27 ENCOUNTER — Ambulatory Visit
Admission: RE | Admit: 2022-05-27 | Discharge: 2022-05-27 | Disposition: A | Discharge status: Home / Self Care | Payer: Managed Care, Other (non HMO) | Source: Ambulatory Visit | Attending: General Surgery | Admitting: General Surgery

## 2022-05-27 DIAGNOSIS — Z1231 Encounter for screening mammogram for malignant neoplasm of breast: Secondary | ICD-10-CM

## 2022-11-07 ENCOUNTER — Other Ambulatory Visit: Payer: Self-pay | Admitting: General Surgery

## 2022-11-07 DIAGNOSIS — Z1501 Genetic susceptibility to malignant neoplasm of breast: Secondary | ICD-10-CM

## 2022-12-17 ENCOUNTER — Ambulatory Visit
Admission: RE | Admit: 2022-12-17 | Discharge: 2022-12-17 | Disposition: A | Discharge status: Home / Self Care | Payer: 59 | Source: Ambulatory Visit | Attending: General Surgery | Admitting: General Surgery

## 2022-12-17 DIAGNOSIS — Z1501 Genetic susceptibility to malignant neoplasm of breast: Secondary | ICD-10-CM

## 2022-12-17 MED ORDER — GADOPICLENOL 0.5 MMOL/ML IV SOLN
10.0000 mL | Freq: Once | INTRAVENOUS | Status: AC | PRN
  Administered 2022-12-17: 10 mL via INTRAVENOUS

## 2023-05-03 ENCOUNTER — Other Ambulatory Visit: Payer: Self-pay | Admitting: Hematology and Oncology

## 2023-05-03 DIAGNOSIS — Z1501 Genetic susceptibility to malignant neoplasm of breast: Secondary | ICD-10-CM

## 2023-05-04 ENCOUNTER — Inpatient Hospital Stay: Payer: 59 | Admitting: Hematology and Oncology

## 2023-05-09 ENCOUNTER — Inpatient Hospital Stay: Payer: 59 | Admitting: Hematology and Oncology

## 2023-05-09 NOTE — Assessment & Plan Note (Signed)
Surveillance: 1. Breast MRI 05/27/2021: No evidence of malignancy.  Breast density category A 2. left axillary ultrasound 01/15/2020: Decrease in thickness of the 2 left axillary lymph nodes 3. MRI abdomen: MRCP: No evidence of pancreatic mass mild hepatic steatosis 4.  Mammogram 09/21/2020: [facility]: Benign, breast density category B 5.  Breast exam 05/03/2022: Benign   Breast conserving surgery on 03/17/2020: CSL, UDH, Papilloma Risk reduction: Patient was offered tamoxifen but she decided not to take it   Plan:  Breast MRI 12/19/2022: Benign breast density category B Mammogram 05/27/2022: Benign breast density category B   I will see her back in 1 year for follow-up

## 2023-06-02 ENCOUNTER — Ambulatory Visit: Payer: 59

## 2023-06-15 ENCOUNTER — Ambulatory Visit: Payer: 59

## 2023-06-29 ENCOUNTER — Ambulatory Visit
Admission: RE | Admit: 2023-06-29 | Discharge: 2023-06-29 | Disposition: A | Discharge status: Home / Self Care | Payer: 59 | Source: Ambulatory Visit | Attending: Hematology and Oncology | Admitting: Hematology and Oncology

## 2023-06-29 DIAGNOSIS — Z1501 Genetic susceptibility to malignant neoplasm of breast: Secondary | ICD-10-CM

## 2023-07-13 ENCOUNTER — Inpatient Hospital Stay: Payer: 59 | Attending: Hematology and Oncology | Admitting: Hematology and Oncology

## 2023-07-13 VITALS — BP 127/63 | HR 96 | Temp 98.1°F | Resp 16 | Wt 240.6 lb

## 2023-07-13 DIAGNOSIS — Z800 Family history of malignant neoplasm of digestive organs: Secondary | ICD-10-CM | POA: Diagnosis not present

## 2023-07-13 DIAGNOSIS — Z8042 Family history of malignant neoplasm of prostate: Secondary | ICD-10-CM | POA: Diagnosis not present

## 2023-07-13 DIAGNOSIS — Z803 Family history of malignant neoplasm of breast: Secondary | ICD-10-CM | POA: Insufficient documentation

## 2023-07-13 DIAGNOSIS — Z1501 Genetic susceptibility to malignant neoplasm of breast: Secondary | ICD-10-CM | POA: Diagnosis present

## 2023-07-13 DIAGNOSIS — Z801 Family history of malignant neoplasm of trachea, bronchus and lung: Secondary | ICD-10-CM | POA: Diagnosis not present

## 2023-07-13 DIAGNOSIS — Z1509 Genetic susceptibility to other malignant neoplasm: Secondary | ICD-10-CM | POA: Diagnosis not present

## 2023-07-13 DIAGNOSIS — Z1502 Genetic susceptibility to malignant neoplasm of ovary: Secondary | ICD-10-CM

## 2023-07-13 NOTE — Assessment & Plan Note (Signed)
Surveillance: 1. Breast MRI 12/19/2022: No evidence of malignancy.  Breast density category B 2. MRI abdomen: MRCP: No evidence of pancreatic mass mild hepatic steatosis 4.  Mammogram 07/03/2023: Benign, breast density category B 5.  Breast exam 07/13/2023: Benign   Breast conserving surgery on 03/17/2020: CSL, UDH, Papilloma Risk reduction: Patient was offered tamoxifen but she decided not to take it   Return to clinic in 1 year for follow-up

## 2023-07-13 NOTE — Progress Notes (Signed)
Patient Care Team: Audie Pinto, FNP as PCP - General (Family Medicine) Emelia Loron, MD as Consulting Physician (General Surgery)  DIAGNOSIS:  Encounter Diagnosis  Name Primary?   BRCA2 gene mutation positive in female Yes    CHIEF COMPLIANT: Follow-up of history of BRCA2 mutation positivity  HISTORY OF PRESENT ILLNESS:  History of Present Illness   April Valdez is a 38 year old female who presents for routine follow-up regarding BRCA gene mutation surveillance.  She recently underwent a mammogram last week and an MRI in July of the previous year, both of which were normal. She reports no new symptoms or concerns at this time.  Her family history is significant for cancer, including her mother who died from breast cancer in 23-Jul-1995, an uncle who died from pancreatic cancer in 07-22-09, and an aunt who died from pancreatic cancer in 07/23/2015. Another uncle passed away from lung cancer in Jul 22, 2014, and her grandfather had prostate cancer but survived. Her sister tested negative for the BRCA gene mutation.  She is not currently taking tamoxifen and is uncertain about its benefits given the associated risks.  She is currently not experiencing any issues with the MRI procedures and describes them as routine, stating she 'just goes in there and takes a nap.'         ALLERGIES:  is allergic to peanut-containing drug products and fluoxetine.  MEDICATIONS:  Current Outpatient Medications  Medication Sig Dispense Refill   Multiple Vitamins-Minerals (MULTIVITAMIN GUMMIES ADULT PO) Take 2 tablets by mouth daily.     valACYclovir (VALTREX) 500 MG tablet Take by mouth 2 (two) times daily as needed.     No current facility-administered medications for this visit.    PHYSICAL EXAMINATION: ECOG PERFORMANCE STATUS: 1 - Symptomatic but completely ambulatory  Vitals:   07/13/23 1410  BP: 127/63  Pulse: 96  Resp: 16  Temp: 98.1 F (36.7 C)  SpO2: 99%   Filed Weights   07/13/23  1410  Weight: 240 lb 9.6 oz (109.1 kg)    Physical Exam          (exam performed in the presence of a chaperone)  LABORATORY DATA:  I have reviewed the data as listed    Latest Ref Rng & Units 02/24/2020    8:23 AM 12/06/2015   10:27 AM 01/21/2015    7:00 PM  CMP  Glucose 70 - 99 mg/dL 409  811  92   BUN 6 - 23 mg/dL 16  14  12    Creatinine 0.40 - 1.20 mg/dL 9.14  7.82  9.56   Sodium 135 - 145 mEq/L 141  135  139   Potassium 3.5 - 5.1 mEq/L 4.2  3.7  3.8   Chloride 96 - 112 mEq/L 108  105  106   CO2 19 - 32 mEq/L 27  26  27    Calcium 8.4 - 10.5 mg/dL 8.9  8.8  9.3   Total Protein 6.5 - 8.1 g/dL   7.8   Total Bilirubin 0.3 - 1.2 mg/dL   0.4   Alkaline Phos 38 - 126 U/L   46   AST 15 - 41 U/L   15   ALT 14 - 54 U/L   15     Lab Results  Component Value Date   WBC 4.6 12/06/2015   HGB 11.0 (L) 12/06/2015   HCT 33.4 (L) 12/06/2015   MCV 81.7 12/06/2015   PLT 227 12/06/2015   NEUTROABS 2.2 12/06/2015  ASSESSMENT & PLAN:  BRCA2 gene mutation positive in female Surveillance: 1. Breast MRI 12/19/2022: No evidence of malignancy.  Breast density category B 2. MRI abdomen: MRCP: No evidence of pancreatic mass mild hepatic steatosis 4.  Mammogram 07/03/2023: Benign, breast density category B 5.  Breast exam 07/13/2023: Benign   Breast conserving surgery on 03/17/2020: CSL, UDH, Papilloma Risk reduction: Patient was offered tamoxifen but she decided not to take it We discussed once again the pros and cons of tamoxifen therapy and determined that the risks outweigh benefits.  I sent a referral to gastroenterology for pancreatic cancer surveillance.  There is family history of pancreatic cancer. I ordered breast MRI to be done next year. Return to clinic in 1 year for follow-up    Orders Placed This Encounter  Procedures   MR BREAST BILATERAL W WO CONTRAST INC CAD    Standing Status:   Future    Expected Date:   01/10/2024    Expiration Date:   07/12/2024    If indicated  for the ordered procedure, I authorize the administration of contrast media per Radiology protocol:   Yes    What is the patient's sedation requirement?:   No Sedation    Does the patient have a pacemaker or implanted devices?:   No    Preferred imaging location?:   [facility] (table limit-550lbs)    Release to patient:   Immediate   Ambulatory referral to Gastroenterology    Referral Priority:   Routine    Referral Type:   Consultation    Referral Reason:   Specialty Services Required    Referred to Provider:   Shellia Cleverly, DO    Number of Visits Requested:   1   The patient has a good understanding of the overall plan. she agrees with it. she will call with any problems that may develop before the next visit here. Total time spent: 30 mins including face to face time and time spent for planning, charting and co-ordination of care   Tamsen Meek, MD 07/13/23

## 2024-01-05 ENCOUNTER — Encounter: Payer: Self-pay | Admitting: Hematology and Oncology

## 2024-01-10 ENCOUNTER — Other Ambulatory Visit: Payer: 59

## 2024-02-09 ENCOUNTER — Other Ambulatory Visit

## 2024-03-08 ENCOUNTER — Other Ambulatory Visit

## 2024-03-31 ENCOUNTER — Ambulatory Visit
Admission: RE | Admit: 2024-03-31 | Discharge: 2024-03-31 | Disposition: A | Discharge status: Home / Self Care | Source: Ambulatory Visit | Attending: Hematology and Oncology | Admitting: Hematology and Oncology

## 2024-03-31 DIAGNOSIS — Z15068 Genetic susceptibility to other malignant neoplasm of digestive system: Secondary | ICD-10-CM

## 2024-03-31 MED ORDER — GADOPICLENOL 0.5 MMOL/ML IV SOLN
10.0000 mL | Freq: Once | INTRAVENOUS | Status: AC | PRN
Start: 2024-03-31 — End: 2024-03-31
  Administered 2024-03-31: 10 mL via INTRAVENOUS

## 2024-04-01 ENCOUNTER — Telehealth: Payer: Self-pay | Admitting: Hematology and Oncology

## 2024-04-01 NOTE — Telephone Encounter (Signed)
 Inform the patient of the breast MRI is normal

## 2024-07-15 ENCOUNTER — Ambulatory Visit: Payer: 59 | Admitting: Hematology and Oncology
